# Patient Record
Sex: Male | Born: 1950
Health system: Southern US, Community
[De-identification: ages and names within clinical notes are randomized; demographics above are authoritative.]

## PROBLEM LIST (undated history)

## (undated) ENCOUNTER — Ambulatory Visit: Admission: EM | Payer: PPO

## (undated) DIAGNOSIS — I10 Essential (primary) hypertension: Secondary | ICD-10-CM

## (undated) DIAGNOSIS — N529 Male erectile dysfunction, unspecified: Secondary | ICD-10-CM

## (undated) DIAGNOSIS — M1990 Unspecified osteoarthritis, unspecified site: Secondary | ICD-10-CM

## (undated) DIAGNOSIS — E039 Hypothyroidism, unspecified: Secondary | ICD-10-CM

## (undated) DIAGNOSIS — L80 Vitiligo: Secondary | ICD-10-CM

## (undated) HISTORY — PX: ARTHROSCOPIC REPAIR ACL: SUR80

---

## 1998-11-09 HISTORY — PX: THYROID CYST EXCISION: SHX2511

## 2005-07-08 ENCOUNTER — Ambulatory Visit: Payer: Self-pay | Admitting: Internal Medicine

## 2007-01-24 ENCOUNTER — Ambulatory Visit: Payer: Self-pay | Admitting: Gastroenterology

## 2008-12-05 ENCOUNTER — Ambulatory Visit: Payer: Self-pay | Admitting: Internal Medicine

## 2012-02-26 ENCOUNTER — Ambulatory Visit: Payer: Self-pay

## 2012-03-07 ENCOUNTER — Ambulatory Visit: Payer: Self-pay

## 2012-10-21 IMAGING — CR DG SHOULDER 3+V*L*
1 series · 3 of 3 positions shown · non-contrast
Comparison: none

REASON FOR EXAM: L shoulder pain for a week
COMMENTS:

PROCEDURE:     MDR - MDR SHOULDER LEFT COMPLETE  - March 07, 2012  [DATE]
RESULT:     Degenerative change present. No acute abnormality identified. No
evidence of fracture.

[Series 1: internal rotate · 0.17mm/px · 3 of 3 slices shown]
[im 1/3]
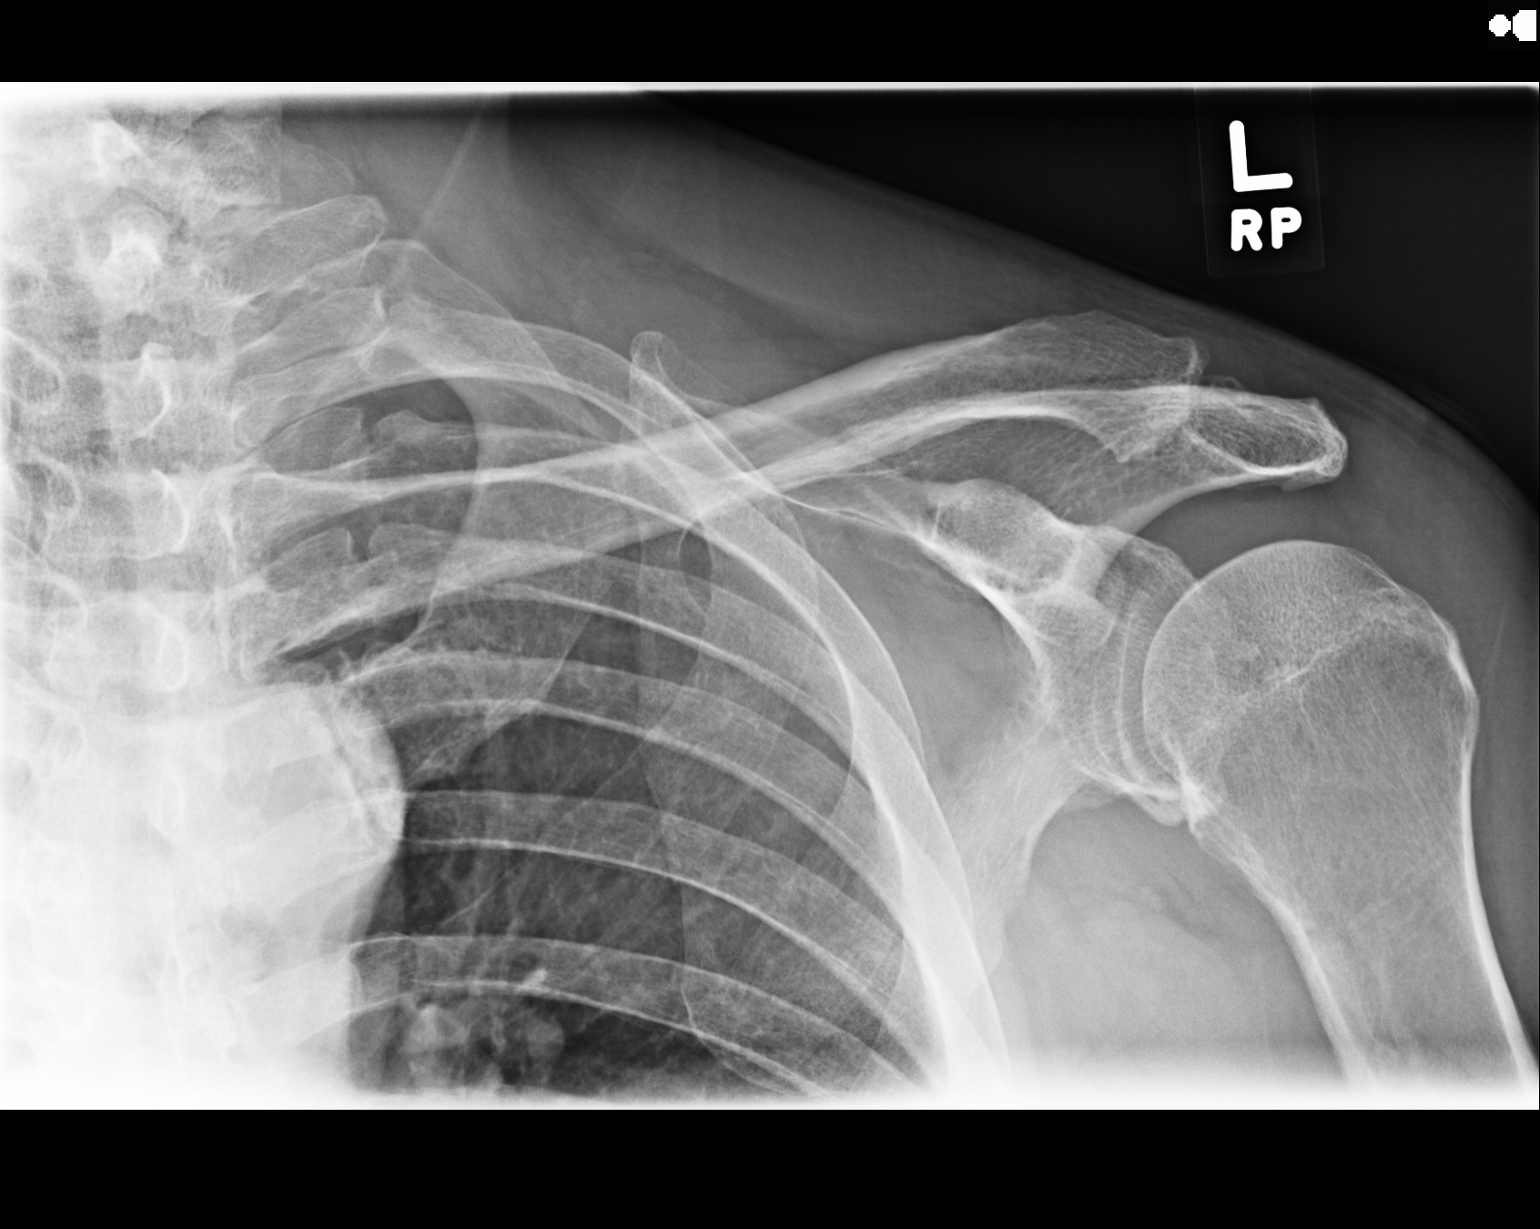
[im 2/3]
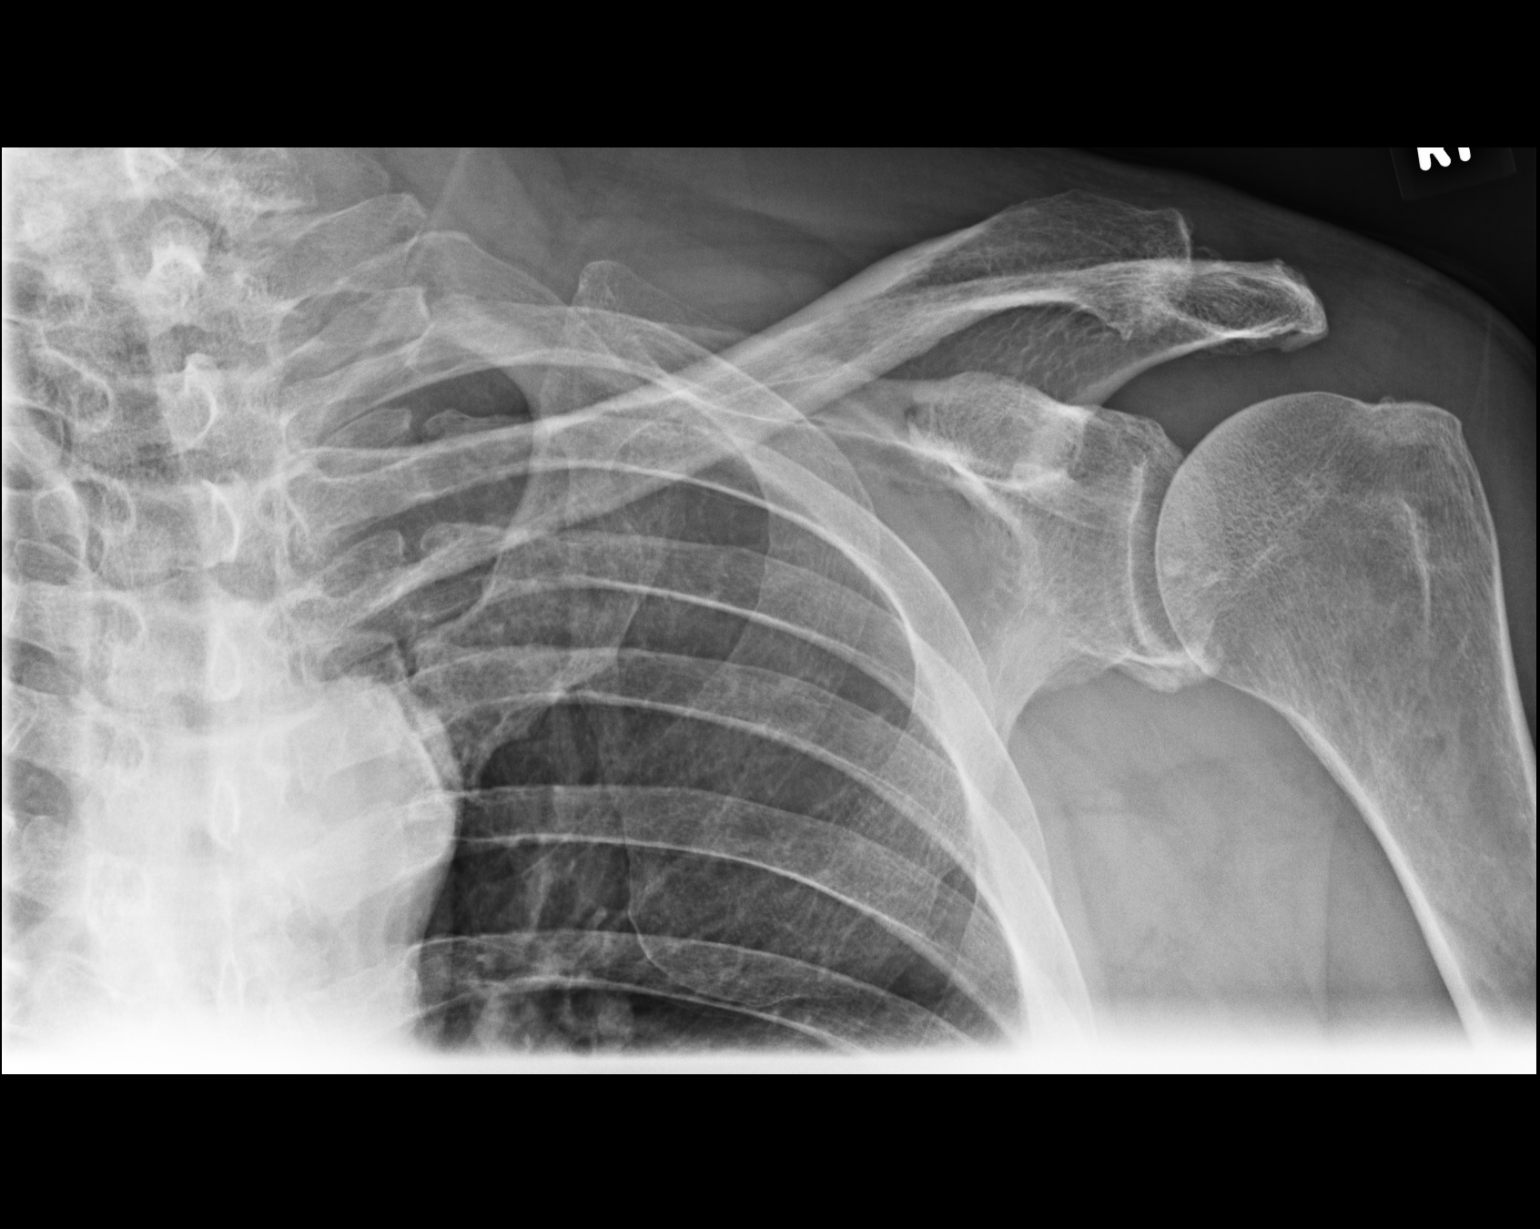
[im 3/3]
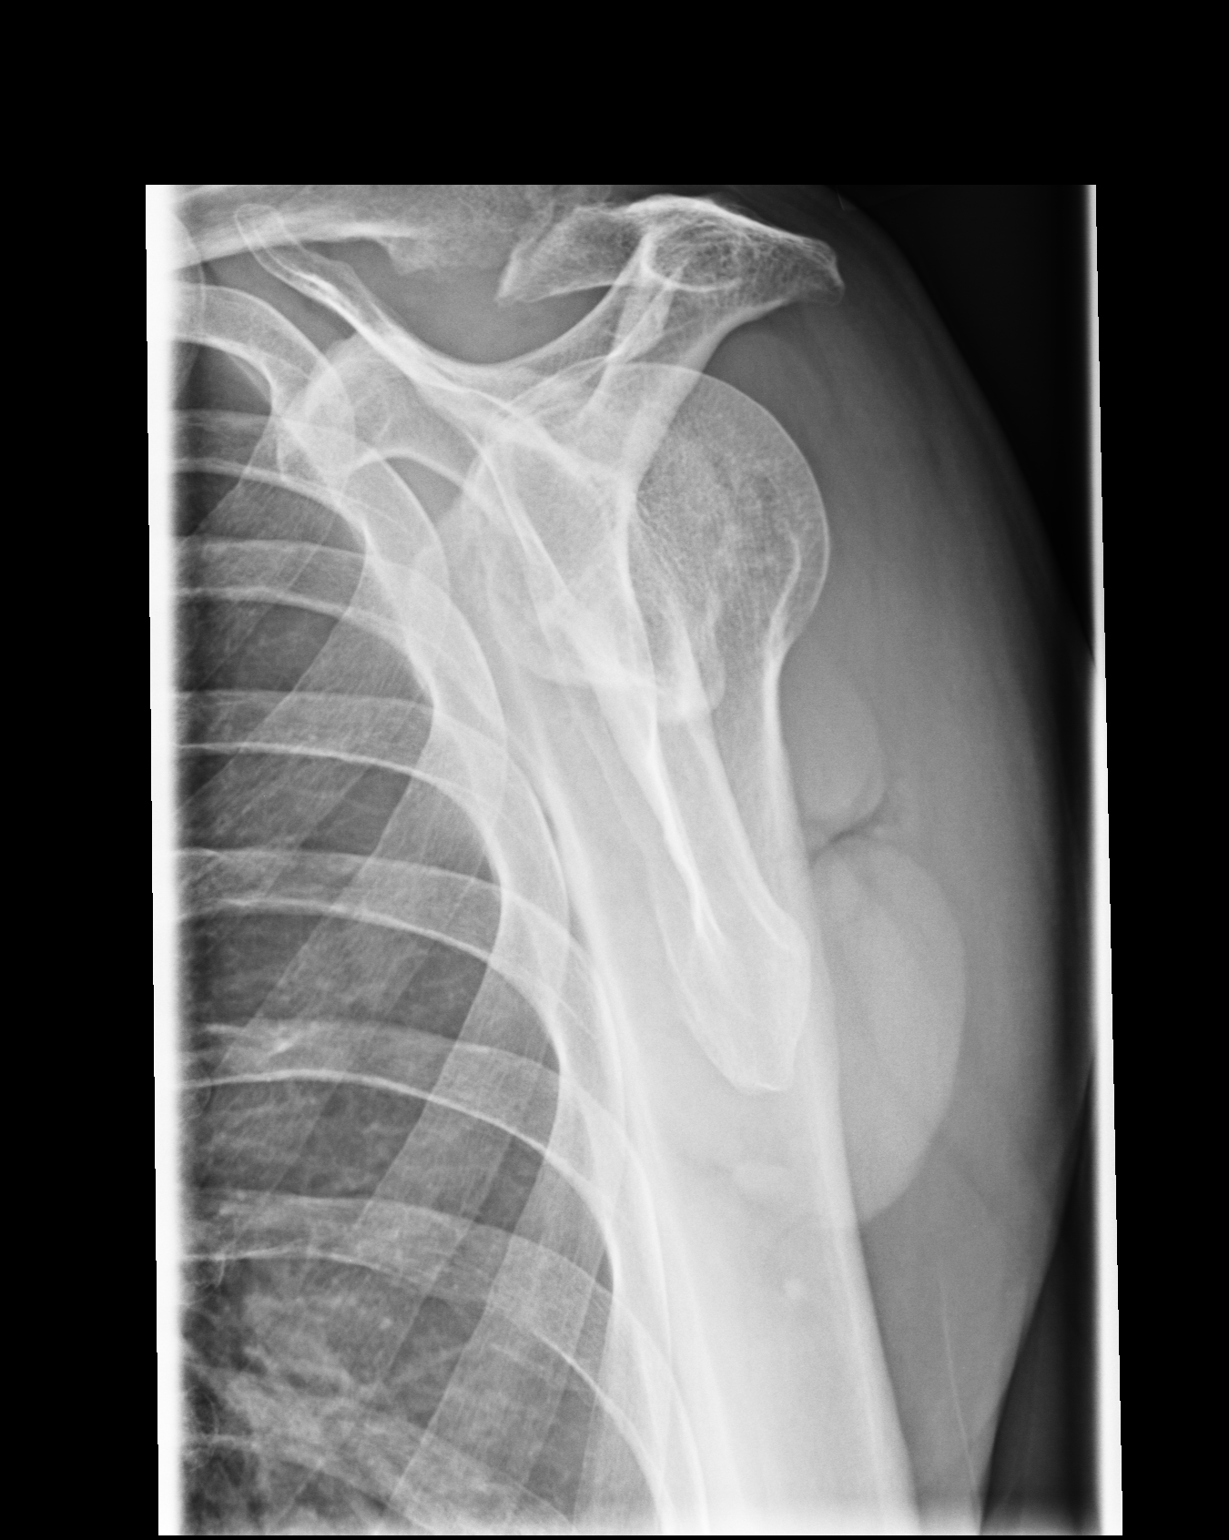

[3 of 3 positions shown; findings below may reference images not displayed]

IMPRESSION: No acute abnormality.

## 2013-01-02 ENCOUNTER — Ambulatory Visit: Payer: Self-pay | Admitting: Gastroenterology

## 2014-03-20 DIAGNOSIS — N529 Male erectile dysfunction, unspecified: Secondary | ICD-10-CM | POA: Insufficient documentation

## 2014-03-20 DIAGNOSIS — K635 Polyp of colon: Secondary | ICD-10-CM | POA: Insufficient documentation

## 2014-03-20 DIAGNOSIS — L80 Vitiligo: Secondary | ICD-10-CM | POA: Insufficient documentation

## 2014-03-20 DIAGNOSIS — M1990 Unspecified osteoarthritis, unspecified site: Secondary | ICD-10-CM | POA: Insufficient documentation

## 2014-08-13 DIAGNOSIS — I10 Essential (primary) hypertension: Secondary | ICD-10-CM | POA: Insufficient documentation

## 2014-08-13 DIAGNOSIS — E039 Hypothyroidism, unspecified: Secondary | ICD-10-CM | POA: Insufficient documentation

## 2016-01-17 ENCOUNTER — Encounter: Payer: Self-pay | Admitting: *Deleted

## 2016-01-20 ENCOUNTER — Ambulatory Visit
Admission: RE | Admit: 2016-01-20 | Discharge: 2016-01-20 | Disposition: A | Discharge status: Home / Self Care | Payer: Commercial Managed Care - PPO | Source: Ambulatory Visit | Attending: Gastroenterology | Admitting: Gastroenterology

## 2016-01-20 ENCOUNTER — Encounter
Admission: RE | Disposition: A | Discharge status: Home / Self Care | Payer: Self-pay | Source: Ambulatory Visit | Attending: Gastroenterology

## 2016-01-20 ENCOUNTER — Ambulatory Visit: Payer: Commercial Managed Care - PPO | Admitting: Certified Registered"

## 2016-01-20 ENCOUNTER — Encounter: Payer: Self-pay | Admitting: *Deleted

## 2016-01-20 DIAGNOSIS — M19012 Primary osteoarthritis, left shoulder: Secondary | ICD-10-CM | POA: Diagnosis not present

## 2016-01-20 DIAGNOSIS — K2960 Other gastritis without bleeding: Secondary | ICD-10-CM | POA: Diagnosis not present

## 2016-01-20 DIAGNOSIS — Z859 Personal history of malignant neoplasm, unspecified: Secondary | ICD-10-CM | POA: Insufficient documentation

## 2016-01-20 DIAGNOSIS — Z7951 Long term (current) use of inhaled steroids: Secondary | ICD-10-CM | POA: Insufficient documentation

## 2016-01-20 DIAGNOSIS — E039 Hypothyroidism, unspecified: Secondary | ICD-10-CM | POA: Diagnosis not present

## 2016-01-20 DIAGNOSIS — M19041 Primary osteoarthritis, right hand: Secondary | ICD-10-CM | POA: Insufficient documentation

## 2016-01-20 DIAGNOSIS — K317 Polyp of stomach and duodenum: Secondary | ICD-10-CM | POA: Diagnosis not present

## 2016-01-20 DIAGNOSIS — I10 Essential (primary) hypertension: Secondary | ICD-10-CM | POA: Insufficient documentation

## 2016-01-20 DIAGNOSIS — D509 Iron deficiency anemia, unspecified: Secondary | ICD-10-CM | POA: Diagnosis present

## 2016-01-20 DIAGNOSIS — Z86010 Personal history of colonic polyps: Secondary | ICD-10-CM | POA: Insufficient documentation

## 2016-01-20 DIAGNOSIS — L80 Vitiligo: Secondary | ICD-10-CM | POA: Insufficient documentation

## 2016-01-20 DIAGNOSIS — M19011 Primary osteoarthritis, right shoulder: Secondary | ICD-10-CM | POA: Insufficient documentation

## 2016-01-20 DIAGNOSIS — Z79899 Other long term (current) drug therapy: Secondary | ICD-10-CM | POA: Diagnosis not present

## 2016-01-20 DIAGNOSIS — K5730 Diverticulosis of large intestine without perforation or abscess without bleeding: Secondary | ICD-10-CM | POA: Diagnosis not present

## 2016-01-20 DIAGNOSIS — M19042 Primary osteoarthritis, left hand: Secondary | ICD-10-CM | POA: Insufficient documentation

## 2016-01-20 HISTORY — DX: Hypothyroidism, unspecified: E03.9

## 2016-01-20 HISTORY — PX: COLONOSCOPY WITH PROPOFOL: SHX5780

## 2016-01-20 HISTORY — DX: Vitiligo: L80

## 2016-01-20 HISTORY — DX: Male erectile dysfunction, unspecified: N52.9

## 2016-01-20 HISTORY — DX: Essential (primary) hypertension: I10

## 2016-01-20 HISTORY — DX: Unspecified osteoarthritis, unspecified site: M19.90

## 2016-01-20 HISTORY — PX: ESOPHAGOGASTRODUODENOSCOPY (EGD) WITH PROPOFOL: SHX5813

## 2016-01-20 SURGERY — COLONOSCOPY WITH PROPOFOL
Anesthesia: General

## 2016-01-20 MED ORDER — SODIUM CHLORIDE 0.9 % IV SOLN
INTRAVENOUS | Status: DC
  Administered 2016-01-20 (×2): via INTRAVENOUS

## 2016-01-20 MED ORDER — MIDAZOLAM HCL 2 MG/2ML IJ SOLN
INTRAMUSCULAR | Status: DC | PRN
Start: 2016-01-20 — End: 2016-01-20
  Administered 2016-01-20: 2 mg via INTRAVENOUS

## 2016-01-20 MED ORDER — SODIUM CHLORIDE 0.9 % IV SOLN: INTRAVENOUS | Status: DC

## 2016-01-20 MED ORDER — LIDOCAINE HCL (CARDIAC) 20 MG/ML IV SOLN
INTRAVENOUS | Status: DC | PRN
  Administered 2016-01-20: 60 mg via INTRAVENOUS

## 2016-01-20 MED ORDER — PROPOFOL 500 MG/50ML IV EMUL
INTRAVENOUS | Status: DC | PRN
  Administered 2016-01-20: 150 ug/kg/min via INTRAVENOUS

## 2016-01-20 MED ORDER — PROPOFOL 10 MG/ML IV BOLUS
INTRAVENOUS | Status: DC | PRN
  Administered 2016-01-20: 40 mg via INTRAVENOUS

## 2016-01-20 MED ORDER — PHENYLEPHRINE HCL 10 MG/ML IJ SOLN
INTRAMUSCULAR | Status: DC | PRN
  Administered 2016-01-20: 100 ug via INTRAVENOUS

## 2016-01-20 NOTE — Op Note (Signed)
[facility] Gastroenterology Patient Name: Mitchell Mcdowell Procedure Date: 01/20/2016 1:10 PM MRN: [id number] Account #: [id number] Date of Birth: 1951/01/21 Admit Type: Outpatient Age: 65 Room: [facility] ENDO ROOM 4 Gender: Male Note Status: Finalized Procedure:            Upper GI endoscopy Indications:          Iron deficiency anemia Providers:            Lupita Dawn. Candace Cruise, MD Referring MD:         Dion Body (Referring MD) Medicines:            Monitored Anesthesia Care Complications:        No immediate complications. Procedure:            Pre-Anesthesia Assessment:                       - Prior to the procedure, a History and Physical was                        performed, and patient medications, allergies and                        sensitivities were reviewed. The patient's tolerance of                        previous anesthesia was reviewed.                       - The risks and benefits of the procedure and the                        sedation options and risks were discussed with the                        patient. All questions were answered and informed                        consent was obtained.                       - After reviewing the risks and benefits, the patient                        was deemed in satisfactory condition to undergo the                        procedure.                       After obtaining informed consent, the endoscope was                        passed under direct vision. Throughout the procedure,                        the patient's blood pressure, pulse, and oxygen                        saturations were monitored continuously. The Endoscope  was introduced through the mouth, and advanced to the                        second part of duodenum. The upper GI endoscopy was                        accomplished without difficulty. The patient tolerated                        the procedure well. Findings:  The examined esophagus was normal.      Multiple small sessile polyps were found in the gastric fundus. Biopsies       were taken with a cold forceps for histology.      The exam was otherwise without abnormality.      The examined duodenum was normal. Impression:           - Normal esophagus.                       - Multiple gastric polyps. Biopsied.                       - The examination was otherwise normal.                       - Normal examined duodenum. Recommendation:       - Discharge patient to home.                       - Observe patient's clinical course.                       - Await pathology results.                       - The findings and recommendations were discussed with                        the patient. Procedure Code(s):    --- Professional ---                       [phone], Esophagogastroduodenoscopy, flexible, transoral;                        with biopsy, single or multiple Diagnosis Code(s):    --- Professional ---                       K31.7, Polyp of stomach and duodenum                       D50.9, Iron deficiency anemia, unspecified CPT copyright 2016 American Medical Association. All rights reserved. The codes documented in this report are preliminary and upon coder review may  be revised to meet current compliance requirements. Hulen Luster, MD 01/20/2016 1:19:34 PM This report has been signed electronically. Number of Addenda: 0 Note Initiated On: 01/20/2016 1:10 PM      [facility]

## 2016-01-20 NOTE — H&P (Signed)
  Date of Initial H&P:12/31/2015  History reviewed, patient examined, no change in status, stable for surgery.

## 2016-01-20 NOTE — Anesthesia Preprocedure Evaluation (Signed)
Anesthesia Evaluation  Patient identified by MRN, date of birth, ID band Patient awake    Reviewed: Allergy & Precautions, H&P , NPO status , Patient's Chart, lab work & pertinent test results, reviewed documented beta blocker date and time   Airway Mallampati: II   Neck ROM: full    Dental  (+) Poor Dentition   Pulmonary neg pulmonary ROS,    Pulmonary exam normal        Cardiovascular hypertension, negative cardio ROS Normal cardiovascular exam     Neuro/Psych negative neurological ROS  negative psych ROS   GI/Hepatic negative GI ROS, Neg liver ROS,   Endo/Other  negative endocrine ROSHypothyroidism   Renal/GU negative Renal ROS  negative genitourinary   Musculoskeletal   Abdominal   Peds  Hematology negative hematology ROS (+)   Anesthesia Other Findings Past Medical History:   Hypertension                                                 Hypothyroidism                                               Arthritis                                                    Vitiligo                                                     Impotence                                                  Past Surgical History:   ARTHROSCOPIC REPAIR ACL                         Right 35 years *   THYROID CYST EXCISION                            2000       BMI    Body Mass Index   24.41 kg/m 2     Reproductive/Obstetrics                             Anesthesia Physical Anesthesia Plan  ASA: III  Anesthesia Plan: General   Post-op Pain Management:    Induction:   Airway Management Planned:   Additional Equipment:   Intra-op Plan:   Post-operative Plan:   Informed Consent: I have reviewed the patients History and Physical, chart, labs and discussed the procedure including the risks, benefits and alternatives for the proposed anesthesia with the patient or authorized representative who has indicated  his/her understanding and acceptance.  Dental Advisory Given  Plan Discussed with: CRNA  Anesthesia Plan Comments:         Anesthesia Quick Evaluation

## 2016-01-20 NOTE — Transfer of Care (Signed)
Immediate Anesthesia Transfer of Care Note  Patient: Mitchell Mcdowell  Procedure(s) Performed: Procedure(s): COLONOSCOPY WITH PROPOFOL (N/A) ESOPHAGOGASTRODUODENOSCOPY (EGD) WITH PROPOFOL (N/A)  Patient Location: Endoscopy Unit  Anesthesia Type:General  Level of Consciousness: awake, alert , oriented and patient cooperative  Airway & Oxygen Therapy: Patient Spontanous Breathing and Patient connected to nasal cannula oxygen  Post-op Assessment: Report given to RN, Post -op Vital signs reviewed and stable and Patient moving all extremities X 4  Post vital signs: Reviewed and stable  Last Vitals:  Filed Vitals:   01/20/16 1246  BP: 149/84  Pulse: 82  Temp: 36.4 C  Resp: 18    Complications: No apparent anesthesia complications

## 2016-01-20 NOTE — Op Note (Signed)
[facility] Gastroenterology Patient Name: Mitchell Mcdowell Procedure Date: 01/20/2016 1:09 PM MRN: [id number] Account #: [id number] Date of Birth: Feb 29, 1951 Admit Type: Outpatient Age: 65 Room: [facility] ENDO ROOM 4 Gender: Male Note Status: Finalized Procedure:            Colonoscopy Indications:          Iron deficiency anemia, Personal history of colonic                        polyps Providers:            Lupita Dawn. Candace Cruise, MD Referring MD:         Dion Body (Referring MD) Medicines:            Monitored Anesthesia Care Complications:        No immediate complications. Procedure:            Pre-Anesthesia Assessment:                       - Prior to the procedure, a History and Physical was                        performed, and patient medications, allergies and                        sensitivities were reviewed. The patient's tolerance of                        previous anesthesia was reviewed.                       - The risks and benefits of the procedure and the                        sedation options and risks were discussed with the                        patient. All questions were answered and informed                        consent was obtained.                       - After reviewing the risks and benefits, the patient                        was deemed in satisfactory condition to undergo the                        procedure.                       After obtaining informed consent, the colonoscope was                        passed under direct vision. Throughout the procedure,                        the patient's blood pressure, pulse, and oxygen                        saturations were monitored  continuously. The                        Colonoscope was introduced through the anus and                        advanced to the the cecum, identified by appendiceal                        orifice and ileocecal valve. The colonoscopy was   performed without difficulty. The patient tolerated the                        procedure well. The quality of the bowel preparation                        was fair. Findings:      A few small and large-mouthed diverticula were found in the sigmoid       colon.      The exam was otherwise without abnormality. Impression:           - Preparation of the colon was fair.                       - Diverticulosis in the sigmoid colon.                       - The examination was otherwise normal.                       - No specimens collected. Recommendation:       - Discharge patient to home.                       - Repeat colonoscopy in 5 years for surveillance. Procedure Code(s):    --- Professional ---                       [phone], Colonoscopy, flexible; diagnostic, including                        collection of specimen(s) by brushing or washing, when                        performed (separate procedure) Diagnosis Code(s):    --- Professional ---                       D50.9, Iron deficiency anemia, unspecified                       Z86.010, Personal history of colonic polyps                       K57.30, Diverticulosis of large intestine without                        perforation or abscess without bleeding CPT copyright 2016 American Medical Association. All rights reserved. The codes documented in this report are preliminary and upon coder review may  be revised to meet current compliance requirements. Hulen Luster, MD 01/20/2016 1:36:37 PM This report has been signed electronically. Number of Addenda: 0 Note Initiated On: 01/20/2016 1:09 PM Scope  Withdrawal Time: 0 hours 8 minutes 13 seconds  Total Procedure Duration: 0 hours 12 minutes 16 seconds       [facility]

## 2016-01-21 ENCOUNTER — Encounter: Payer: Self-pay | Admitting: Gastroenterology

## 2016-01-21 LAB — SURGICAL PATHOLOGY

## 2016-01-21 NOTE — Anesthesia Postprocedure Evaluation (Signed)
Anesthesia Post Note  Patient: Mitchell Mcdowell  Procedure(s) Performed: Procedure(s) (LRB): COLONOSCOPY WITH PROPOFOL (N/A) ESOPHAGOGASTRODUODENOSCOPY (EGD) WITH PROPOFOL (N/A)  Patient location during evaluation: PACU Anesthesia Type: General Level of consciousness: awake and alert Pain management: pain level controlled Vital Signs Assessment: post-procedure vital signs reviewed and stable Respiratory status: spontaneous breathing, nonlabored ventilation, respiratory function stable and patient connected to nasal cannula oxygen Cardiovascular status: blood pressure returned to baseline and stable Postop Assessment: no signs of nausea or vomiting Anesthetic complications: no    Last Vitals:  Filed Vitals:   01/20/16 1358 01/20/16 1408  BP: 106/42 119/77  Pulse: 66 65  Temp:    Resp: 20 17    Last Pain: There were no vitals filed for this visit.               Molli Barrows

## 2019-01-11 DIAGNOSIS — E039 Hypothyroidism, unspecified: Secondary | ICD-10-CM | POA: Diagnosis not present

## 2019-01-11 DIAGNOSIS — I10 Essential (primary) hypertension: Secondary | ICD-10-CM | POA: Diagnosis not present

## 2019-01-11 DIAGNOSIS — Z79899 Other long term (current) drug therapy: Secondary | ICD-10-CM | POA: Diagnosis not present

## 2019-01-11 DIAGNOSIS — M0609 Rheumatoid arthritis without rheumatoid factor, multiple sites: Secondary | ICD-10-CM | POA: Diagnosis not present

## 2019-01-17 DIAGNOSIS — M150 Primary generalized (osteo)arthritis: Secondary | ICD-10-CM | POA: Diagnosis not present

## 2019-01-17 DIAGNOSIS — D696 Thrombocytopenia, unspecified: Secondary | ICD-10-CM | POA: Diagnosis not present

## 2019-01-17 DIAGNOSIS — M0609 Rheumatoid arthritis without rheumatoid factor, multiple sites: Secondary | ICD-10-CM | POA: Diagnosis not present

## 2019-01-17 DIAGNOSIS — Z79899 Other long term (current) drug therapy: Secondary | ICD-10-CM | POA: Diagnosis not present

## 2019-01-18 DIAGNOSIS — I10 Essential (primary) hypertension: Secondary | ICD-10-CM | POA: Diagnosis not present

## 2019-01-18 DIAGNOSIS — E039 Hypothyroidism, unspecified: Secondary | ICD-10-CM | POA: Diagnosis not present

## 2019-01-18 DIAGNOSIS — D696 Thrombocytopenia, unspecified: Secondary | ICD-10-CM | POA: Diagnosis not present

## 2019-04-19 DIAGNOSIS — Z79899 Other long term (current) drug therapy: Secondary | ICD-10-CM | POA: Diagnosis not present

## 2019-04-19 DIAGNOSIS — M0609 Rheumatoid arthritis without rheumatoid factor, multiple sites: Secondary | ICD-10-CM | POA: Diagnosis not present

## 2019-07-13 DIAGNOSIS — Z79899 Other long term (current) drug therapy: Secondary | ICD-10-CM | POA: Diagnosis not present

## 2019-07-13 DIAGNOSIS — M0609 Rheumatoid arthritis without rheumatoid factor, multiple sites: Secondary | ICD-10-CM | POA: Diagnosis not present

## 2019-07-20 DIAGNOSIS — M0609 Rheumatoid arthritis without rheumatoid factor, multiple sites: Secondary | ICD-10-CM | POA: Diagnosis not present

## 2019-07-20 DIAGNOSIS — E039 Hypothyroidism, unspecified: Secondary | ICD-10-CM | POA: Diagnosis not present

## 2019-07-20 DIAGNOSIS — M8949 Other hypertrophic osteoarthropathy, multiple sites: Secondary | ICD-10-CM | POA: Diagnosis not present

## 2019-07-20 DIAGNOSIS — I10 Essential (primary) hypertension: Secondary | ICD-10-CM | POA: Diagnosis not present

## 2019-07-20 DIAGNOSIS — Z0000 Encounter for general adult medical examination without abnormal findings: Secondary | ICD-10-CM | POA: Diagnosis not present

## 2019-07-20 DIAGNOSIS — Z79899 Other long term (current) drug therapy: Secondary | ICD-10-CM | POA: Diagnosis not present

## 2019-07-20 DIAGNOSIS — D696 Thrombocytopenia, unspecified: Secondary | ICD-10-CM | POA: Diagnosis not present

## 2019-07-27 DIAGNOSIS — Z0000 Encounter for general adult medical examination without abnormal findings: Secondary | ICD-10-CM | POA: Diagnosis not present

## 2019-07-27 DIAGNOSIS — I10 Essential (primary) hypertension: Secondary | ICD-10-CM | POA: Diagnosis not present

## 2019-07-27 DIAGNOSIS — E039 Hypothyroidism, unspecified: Secondary | ICD-10-CM | POA: Diagnosis not present

## 2019-07-31 DIAGNOSIS — L80 Vitiligo: Secondary | ICD-10-CM | POA: Diagnosis not present

## 2019-07-31 DIAGNOSIS — L570 Actinic keratosis: Secondary | ICD-10-CM | POA: Diagnosis not present

## 2019-07-31 DIAGNOSIS — L578 Other skin changes due to chronic exposure to nonionizing radiation: Secondary | ICD-10-CM | POA: Diagnosis not present

## 2019-07-31 DIAGNOSIS — Z1283 Encounter for screening for malignant neoplasm of skin: Secondary | ICD-10-CM | POA: Diagnosis not present

## 2019-07-31 DIAGNOSIS — L821 Other seborrheic keratosis: Secondary | ICD-10-CM | POA: Diagnosis not present

## 2019-08-25 DIAGNOSIS — D696 Thrombocytopenia, unspecified: Secondary | ICD-10-CM | POA: Diagnosis not present

## 2019-08-25 DIAGNOSIS — M0609 Rheumatoid arthritis without rheumatoid factor, multiple sites: Secondary | ICD-10-CM | POA: Diagnosis not present

## 2019-08-25 DIAGNOSIS — M8949 Other hypertrophic osteoarthropathy, multiple sites: Secondary | ICD-10-CM | POA: Diagnosis not present

## 2019-09-12 DIAGNOSIS — H2513 Age-related nuclear cataract, bilateral: Secondary | ICD-10-CM | POA: Diagnosis not present

## 2019-09-21 DIAGNOSIS — E039 Hypothyroidism, unspecified: Secondary | ICD-10-CM | POA: Diagnosis not present

## 2019-09-25 ENCOUNTER — Encounter: Payer: Self-pay | Admitting: *Deleted

## 2019-09-25 ENCOUNTER — Other Ambulatory Visit: Payer: Self-pay

## 2019-09-25 DIAGNOSIS — M069 Rheumatoid arthritis, unspecified: Secondary | ICD-10-CM | POA: Diagnosis not present

## 2019-09-25 DIAGNOSIS — H2511 Age-related nuclear cataract, right eye: Secondary | ICD-10-CM | POA: Diagnosis not present

## 2019-09-28 ENCOUNTER — Other Ambulatory Visit
Admission: RE | Admit: 2019-09-28 | Discharge: 2019-09-28 | Disposition: A | Discharge status: Home / Self Care | Payer: PPO | Source: Ambulatory Visit | Attending: Ophthalmology | Admitting: Ophthalmology

## 2019-09-28 DIAGNOSIS — Z01812 Encounter for preprocedural laboratory examination: Secondary | ICD-10-CM | POA: Insufficient documentation

## 2019-09-28 DIAGNOSIS — Z20828 Contact with and (suspected) exposure to other viral communicable diseases: Secondary | ICD-10-CM | POA: Insufficient documentation

## 2019-09-28 LAB — SARS CORONAVIRUS 2 (TAT 6-24 HRS): SARS Coronavirus 2: NEGATIVE

## 2019-09-28 NOTE — Discharge Instructions (Signed)

## 2019-10-02 ENCOUNTER — Ambulatory Visit: Payer: PPO | Admitting: Anesthesiology

## 2019-10-02 ENCOUNTER — Ambulatory Visit
Admission: RE | Admit: 2019-10-02 | Discharge: 2019-10-02 | Disposition: A | Discharge status: Home / Self Care | Payer: PPO | Attending: Ophthalmology | Admitting: Ophthalmology

## 2019-10-02 ENCOUNTER — Encounter
Admission: RE | Disposition: A | Discharge status: Home / Self Care | Payer: Self-pay | Source: Home / Self Care | Attending: Ophthalmology

## 2019-10-02 DIAGNOSIS — I10 Essential (primary) hypertension: Secondary | ICD-10-CM | POA: Diagnosis not present

## 2019-10-02 DIAGNOSIS — H2511 Age-related nuclear cataract, right eye: Secondary | ICD-10-CM | POA: Diagnosis not present

## 2019-10-02 DIAGNOSIS — H25811 Combined forms of age-related cataract, right eye: Secondary | ICD-10-CM | POA: Diagnosis not present

## 2019-10-02 DIAGNOSIS — M069 Rheumatoid arthritis, unspecified: Secondary | ICD-10-CM | POA: Insufficient documentation

## 2019-10-02 HISTORY — PX: CATARACT EXTRACTION W/PHACO: SHX586

## 2019-10-02 SURGERY — PHACOEMULSIFICATION, CATARACT, WITH IOL INSERTION
Anesthesia: Monitor Anesthesia Care | Site: Eye | Laterality: Right

## 2019-10-02 MED ORDER — SODIUM HYALURONATE 23 MG/ML IO SOLN
INTRAOCULAR | Status: DC | PRN
  Administered 2019-10-02: 0.6 mL via INTRAOCULAR

## 2019-10-02 MED ORDER — SODIUM HYALURONATE 10 MG/ML IO SOLN
INTRAOCULAR | Status: DC | PRN
  Administered 2019-10-02: 0.55 mL via INTRAOCULAR

## 2019-10-02 MED ORDER — ONDANSETRON HCL 4 MG/2ML IJ SOLN: 4.0000 mg | Freq: Once | INTRAMUSCULAR | Status: DC | PRN

## 2019-10-02 MED ORDER — EPINEPHRINE PF 1 MG/ML IJ SOLN
INTRAOCULAR | Status: DC | PRN
  Administered 2019-10-02: 62 mL via OPHTHALMIC

## 2019-10-02 MED ORDER — FENTANYL CITRATE (PF) 100 MCG/2ML IJ SOLN
INTRAMUSCULAR | Status: DC | PRN
  Administered 2019-10-02 (×2): 50 ug via INTRAVENOUS

## 2019-10-02 MED ORDER — ARMC OPHTHALMIC DILATING DROPS
1.0000 "application " | OPHTHALMIC | Status: DC | PRN
  Administered 2019-10-02 (×3): 1 via OPHTHALMIC

## 2019-10-02 MED ORDER — LACTATED RINGERS IV SOLN: 100.0000 mL/h | INTRAVENOUS | Status: DC

## 2019-10-02 MED ORDER — MIDAZOLAM HCL 2 MG/2ML IJ SOLN
INTRAMUSCULAR | Status: DC | PRN
  Administered 2019-10-02: 2 mg via INTRAVENOUS

## 2019-10-02 MED ORDER — LIDOCAINE HCL (PF) 2 % IJ SOLN
INTRAOCULAR | Status: DC | PRN
  Administered 2019-10-02: 2 mL via INTRAOCULAR

## 2019-10-02 MED ORDER — MOXIFLOXACIN HCL 0.5 % OP SOLN
OPHTHALMIC | Status: DC | PRN
  Administered 2019-10-02: 0.2 mL via OPHTHALMIC

## 2019-10-02 MED ORDER — TETRACAINE HCL 0.5 % OP SOLN
1.0000 [drp] | OPHTHALMIC | Status: DC | PRN
  Administered 2019-10-02 (×3): 1 [drp] via OPHTHALMIC

## 2019-10-02 SURGICAL SUPPLY — 19 items
CANNULA ANT/CHMB 27G (MISCELLANEOUS) ×2 IMPLANT
CANNULA ANT/CHMB 27GA (MISCELLANEOUS) ×4 IMPLANT
DISSECTOR HYDRO NUCLEUS 50X22 (MISCELLANEOUS) ×2 IMPLANT
GLOVE SURG LX 7.5 STRW (GLOVE) ×1
GLOVE SURG LX STRL 7.5 STRW (GLOVE) ×1 IMPLANT
GLOVE SURG SYN 8.5  E (GLOVE) ×1
GLOVE SURG SYN 8.5 E (GLOVE) ×1 IMPLANT
GLOVE SURG SYN 8.5 PF PI (GLOVE) ×1 IMPLANT
GOWN STRL REUS W/ TWL LRG LVL3 (GOWN DISPOSABLE) ×2 IMPLANT
GOWN STRL REUS W/TWL LRG LVL3 (GOWN DISPOSABLE) ×2
LENS IOL TECNIS ITEC 20.0 (Intraocular Lens) ×1 IMPLANT
MARKER SKIN DUAL TIP RULER LAB (MISCELLANEOUS) ×2 IMPLANT
PACK DR. KING ARMS (PACKS) ×2 IMPLANT
PACK EYE AFTER SURG (MISCELLANEOUS) ×2 IMPLANT
PACK OPTHALMIC (MISCELLANEOUS) ×2 IMPLANT
SYR 3ML LL SCALE MARK (SYRINGE) ×2 IMPLANT
SYR TB 1ML LUER SLIP (SYRINGE) ×2 IMPLANT
WATER STERILE IRR 250ML POUR (IV SOLUTION) ×2 IMPLANT
WIPE NON LINTING 3.25X3.25 (MISCELLANEOUS) ×2 IMPLANT

## 2019-10-02 NOTE — Anesthesia Procedure Notes (Signed)
Procedure Name: MAC Performed by: Izetta Dakin, CRNA Pre-anesthesia Checklist: Timeout performed, Patient being monitored, Suction available, Emergency Drugs available and Patient identified Patient Re-evaluated:Patient Re-evaluated prior to induction Oxygen Delivery Method: Nasal cannula

## 2019-10-02 NOTE — H&P (Signed)

## 2019-10-02 NOTE — Op Note (Signed)
OPERATIVE NOTE  ACHARY ARAGONA [id number] 10/02/2019   PREOPERATIVE DIAGNOSIS:  Nuclear sclerotic cataract right eye.  H25.11   POSTOPERATIVE DIAGNOSIS:    Nuclear sclerotic cataract right eye.     PROCEDURE:  Phacoemusification with posterior chamber intraocular lens placement of the right eye   LENS:   Implant Name Type Inv. Item Serial No. Manufacturer Lot No. LRB No. Used Action  LENS IOL DIOP 20.0 - [id number] Intraocular Lens LENS IOL DIOP 20.0 [id number]  Right 1 Implanted       Procedure(s): CATARACT EXTRACTION PHACO AND INTRAOCULAR LENS PLACEMENT (IOC) RIGHT 2.05,   00:26.0 (Right)  PCB00 +20.0   ULTRASOUND TIME: 0 minutes 26 seconds.  CDE 2.05   SURGEON:  Benay Pillow, MD, MPH  ANESTHESIOLOGIST: Anesthesiologist: Ardeth Sportsman, MD CRNA: Izetta Dakin, CRNA   ANESTHESIA:  Topical with tetracaine drops augmented with 1% preservative-free intracameral lidocaine.  ESTIMATED BLOOD LOSS: less than 1 mL.   COMPLICATIONS:  None.   DESCRIPTION OF PROCEDURE:  The patient was identified in the holding room and transported to the operating room and placed in the supine position under the operating microscope.  The right eye was identified as the operative eye and it was prepped and draped in the usual sterile ophthalmic fashion.   A 1.0 millimeter clear-corneal paracentesis was made at the 10:30 position. 0.5 ml of preservative-free 1% lidocaine with epinephrine was injected into the anterior chamber.  The anterior chamber was filled with Healon 5 viscoelastic.  A 2.4 millimeter keratome was used to make a near-clear corneal incision at the 8:00 position.  A curvilinear capsulorrhexis was made with a cystotome and capsulorrhexis forceps.  Balanced salt solution was used to hydrodissect and hydrodelineate the nucleus.   Phacoemulsification was then used in stop and chop fashion to remove the lens nucleus and epinucleus.  The remaining cortex was then removed using the  irrigation and aspiration handpiece. Healon was then placed into the capsular bag to distend it for lens placement.  A lens was then injected into the capsular bag.  The remaining viscoelastic was aspirated.   Wounds were hydrated with balanced salt solution.  The anterior chamber was inflated to a physiologic pressure with balanced salt solution.   Intracameral vigamox 0.1 mL undiluted was injected into the eye and a drop placed onto the ocular surface.  No wound leaks were noted.  The patient was taken to the recovery room in stable condition without complications of anesthesia or surgery  Benay Pillow 10/02/2019, 9:53 AM

## 2019-10-02 NOTE — Anesthesia Procedure Notes (Signed)
Procedure Name: MAC Performed by: Aliceson Dolbow M, CRNA Pre-anesthesia Checklist: Timeout performed, Patient being monitored, Suction available, Emergency Drugs available and Patient identified Patient Re-evaluated:Patient Re-evaluated prior to induction Oxygen Delivery Method: Nasal cannula       

## 2019-10-02 NOTE — Anesthesia Preprocedure Evaluation (Signed)
Anesthesia Evaluation  Patient identified by MRN, date of birth, ID band Patient awake    Reviewed: Allergy & Precautions, NPO status , Patient's Chart, lab work & pertinent test results, reviewed documented beta blocker date and time   History of Anesthesia Complications Negative for: history of anesthetic complications  Airway Mallampati: II  TM Distance: >3 FB Neck ROM: Full    Dental no notable dental hx.    Pulmonary neg pulmonary ROS,    Pulmonary exam normal breath sounds clear to auscultation       Cardiovascular hypertension, Normal cardiovascular exam Rhythm:Regular Rate:Normal     Neuro/Psych cataract negative psych ROS   GI/Hepatic negative GI ROS, Neg liver ROS,   Endo/Other  Hypothyroidism   Renal/GU negative Renal ROS  negative genitourinary   Musculoskeletal  (+) Arthritis ,   Abdominal Normal abdominal exam  (+)   Peds  Hematology negative hematology ROS (+)   Anesthesia Other Findings   Reproductive/Obstetrics                             Anesthesia Physical Anesthesia Plan  ASA: II  Anesthesia Plan: MAC   Post-op Pain Management:    Induction: Intravenous  PONV Risk Score and Plan: 1 and Ondansetron  Airway Management Planned: Nasal Cannula  Additional Equipment:   Intra-op Plan:   Post-operative Plan:   Informed Consent: I have reviewed the patients History and Physical, chart, labs and discussed the procedure including the risks, benefits and alternatives for the proposed anesthesia with the patient or authorized representative who has indicated his/her understanding and acceptance.     Dental advisory given  Plan Discussed with: CRNA  Anesthesia Plan Comments:         Anesthesia Quick Evaluation

## 2019-10-02 NOTE — Anesthesia Postprocedure Evaluation (Signed)
Anesthesia Post Note  Patient: Mitchell Mcdowell  Procedure(s) Performed: CATARACT EXTRACTION PHACO AND INTRAOCULAR LENS PLACEMENT (IOC) RIGHT 2.05,   00:26.0 (Right Eye)     Anesthesia Post Evaluation  Younes Degeorge

## 2019-10-02 NOTE — Transfer of Care (Signed)
Immediate Anesthesia Transfer of Care Note  Patient: Mitchell Mcdowell  Procedure(s) Performed: CATARACT EXTRACTION PHACO AND INTRAOCULAR LENS PLACEMENT (IOC) RIGHT 2.05,   00:26.0 (Right Eye)  Patient Location: PACU  Anesthesia Type: MAC  Level of Consciousness: awake, alert  and patient cooperative  Airway and Oxygen Therapy: Patient Spontanous Breathing and Patient connected to supplemental oxygen  Post-op Assessment: Post-op Vital signs reviewed, Patient's Cardiovascular Status Stable, Respiratory Function Stable, Patent Airway and No signs of Nausea or vomiting  Post-op Vital Signs: Reviewed and stable  Complications: No apparent anesthesia complications

## 2019-10-03 ENCOUNTER — Encounter: Payer: Self-pay | Admitting: Ophthalmology

## 2019-10-19 DIAGNOSIS — M0609 Rheumatoid arthritis without rheumatoid factor, multiple sites: Secondary | ICD-10-CM | POA: Diagnosis not present

## 2019-10-19 DIAGNOSIS — Z79899 Other long term (current) drug therapy: Secondary | ICD-10-CM | POA: Diagnosis not present

## 2019-10-26 DIAGNOSIS — Z79899 Other long term (current) drug therapy: Secondary | ICD-10-CM | POA: Diagnosis not present

## 2019-10-26 DIAGNOSIS — D696 Thrombocytopenia, unspecified: Secondary | ICD-10-CM | POA: Diagnosis not present

## 2019-10-26 DIAGNOSIS — M0609 Rheumatoid arthritis without rheumatoid factor, multiple sites: Secondary | ICD-10-CM | POA: Diagnosis not present

## 2019-10-26 DIAGNOSIS — M8949 Other hypertrophic osteoarthropathy, multiple sites: Secondary | ICD-10-CM | POA: Diagnosis not present

## 2020-01-22 DIAGNOSIS — M0609 Rheumatoid arthritis without rheumatoid factor, multiple sites: Secondary | ICD-10-CM | POA: Diagnosis not present

## 2020-01-22 DIAGNOSIS — E039 Hypothyroidism, unspecified: Secondary | ICD-10-CM | POA: Diagnosis not present

## 2020-01-22 DIAGNOSIS — Z79899 Other long term (current) drug therapy: Secondary | ICD-10-CM | POA: Diagnosis not present

## 2020-01-22 DIAGNOSIS — I10 Essential (primary) hypertension: Secondary | ICD-10-CM | POA: Diagnosis not present

## 2020-01-24 DIAGNOSIS — E039 Hypothyroidism, unspecified: Secondary | ICD-10-CM | POA: Diagnosis not present

## 2020-01-24 DIAGNOSIS — I10 Essential (primary) hypertension: Secondary | ICD-10-CM | POA: Diagnosis not present

## 2020-01-24 DIAGNOSIS — Z136 Encounter for screening for cardiovascular disorders: Secondary | ICD-10-CM | POA: Diagnosis not present

## 2020-01-24 DIAGNOSIS — D696 Thrombocytopenia, unspecified: Secondary | ICD-10-CM | POA: Diagnosis not present

## 2020-01-24 DIAGNOSIS — M1990 Unspecified osteoarthritis, unspecified site: Secondary | ICD-10-CM | POA: Diagnosis not present

## 2020-04-17 DIAGNOSIS — M0609 Rheumatoid arthritis without rheumatoid factor, multiple sites: Secondary | ICD-10-CM | POA: Diagnosis not present

## 2020-04-17 DIAGNOSIS — Z79899 Other long term (current) drug therapy: Secondary | ICD-10-CM | POA: Diagnosis not present

## 2020-04-25 DIAGNOSIS — M0609 Rheumatoid arthritis without rheumatoid factor, multiple sites: Secondary | ICD-10-CM | POA: Diagnosis not present

## 2020-04-25 DIAGNOSIS — D696 Thrombocytopenia, unspecified: Secondary | ICD-10-CM | POA: Diagnosis not present

## 2020-04-25 DIAGNOSIS — Z79899 Other long term (current) drug therapy: Secondary | ICD-10-CM | POA: Diagnosis not present

## 2020-04-25 DIAGNOSIS — L80 Vitiligo: Secondary | ICD-10-CM | POA: Diagnosis not present

## 2020-04-25 DIAGNOSIS — M8949 Other hypertrophic osteoarthropathy, multiple sites: Secondary | ICD-10-CM | POA: Diagnosis not present

## 2020-07-17 DIAGNOSIS — M0609 Rheumatoid arthritis without rheumatoid factor, multiple sites: Secondary | ICD-10-CM | POA: Diagnosis not present

## 2020-07-17 DIAGNOSIS — Z79899 Other long term (current) drug therapy: Secondary | ICD-10-CM | POA: Diagnosis not present

## 2020-07-17 DIAGNOSIS — I10 Essential (primary) hypertension: Secondary | ICD-10-CM | POA: Diagnosis not present

## 2020-07-17 DIAGNOSIS — Z136 Encounter for screening for cardiovascular disorders: Secondary | ICD-10-CM | POA: Diagnosis not present

## 2020-07-17 DIAGNOSIS — E039 Hypothyroidism, unspecified: Secondary | ICD-10-CM | POA: Diagnosis not present

## 2020-07-26 DIAGNOSIS — R899 Unspecified abnormal finding in specimens from other organs, systems and tissues: Secondary | ICD-10-CM | POA: Diagnosis not present

## 2020-07-26 DIAGNOSIS — I10 Essential (primary) hypertension: Secondary | ICD-10-CM | POA: Diagnosis not present

## 2020-07-26 DIAGNOSIS — Z0000 Encounter for general adult medical examination without abnormal findings: Secondary | ICD-10-CM | POA: Diagnosis not present

## 2020-07-26 DIAGNOSIS — E039 Hypothyroidism, unspecified: Secondary | ICD-10-CM | POA: Diagnosis not present

## 2020-07-26 DIAGNOSIS — D696 Thrombocytopenia, unspecified: Secondary | ICD-10-CM | POA: Diagnosis not present

## 2020-07-31 ENCOUNTER — Ambulatory Visit: Payer: PPO | Admitting: Dermatology

## 2020-07-31 ENCOUNTER — Other Ambulatory Visit: Payer: Self-pay

## 2020-07-31 DIAGNOSIS — L820 Inflamed seborrheic keratosis: Secondary | ICD-10-CM

## 2020-07-31 DIAGNOSIS — L814 Other melanin hyperpigmentation: Secondary | ICD-10-CM | POA: Diagnosis not present

## 2020-07-31 DIAGNOSIS — L821 Other seborrheic keratosis: Secondary | ICD-10-CM

## 2020-07-31 DIAGNOSIS — D1800 Hemangioma unspecified site: Secondary | ICD-10-CM | POA: Diagnosis not present

## 2020-07-31 DIAGNOSIS — L80 Vitiligo: Secondary | ICD-10-CM | POA: Diagnosis not present

## 2020-07-31 DIAGNOSIS — L570 Actinic keratosis: Secondary | ICD-10-CM

## 2020-07-31 DIAGNOSIS — L578 Other skin changes due to chronic exposure to nonionizing radiation: Secondary | ICD-10-CM

## 2020-07-31 DIAGNOSIS — D229 Melanocytic nevi, unspecified: Secondary | ICD-10-CM | POA: Diagnosis not present

## 2020-07-31 DIAGNOSIS — Z1283 Encounter for screening for malignant neoplasm of skin: Secondary | ICD-10-CM

## 2020-07-31 NOTE — Patient Instructions (Signed)
Cryotherapy Aftercare  . Wash gently with soap and water everyday.   . Apply Vaseline and Band-Aid daily until healed. Recommend daily broad spectrum sunscreen SPF 30+ to sun-exposed areas, reapply every 2 hours as needed. Call for new or changing lesions.  

## 2020-07-31 NOTE — Progress Notes (Signed)
   Follow-Up Visit   Subjective  Mitchell Mcdowell is a 69 y.o. male who presents for the following: Annual Exam (1 year f/u TBSE, hx of AKs ) and Vitiligo (hx of Vitiligo on the trunk, exts, head, previous treated years ago with PUVA). The patient presents for Total-Body Skin Exam (TBSE) for skin cancer screening and mole check.  The following portions of the chart were reviewed this encounter and updated as appropriate:  Tobacco  Allergies  Meds  Problems  Med Hx  Surg Hx  Fam Hx     Review of Systems:  No other skin or systemic complaints except as noted in HPI or Assessment and Plan.  Objective  Well appearing patient in no apparent distress; mood and affect are within normal limits.  A full examination was performed including scalp, head, eyes, ears, nose, lips, neck, chest, axillae, abdomen, back, buttocks, bilateral upper extremities, bilateral lower extremities, hands, feet, fingers, toes, fingernails, and toenails. All findings within normal limits unless otherwise noted below.  Objective  hands, arms, face (24): Erythematous thin papules/macules with gritty scale.   Objective  trunk, exts, head: Hypopigmented patches   Objective  R forehead (2): Erythematous keratotic or waxy stuck-on papule or plaque.    Assessment & Plan  AK (actinic keratosis) (24) hands, arms, face  May consider topical cream treatment in the future   Destruction of lesion - hands, arms, face Complexity: simple   Destruction method: cryotherapy   Informed consent: discussed and consent obtained   Timeout:  patient name, date of birth, surgical site, and procedure verified Lesion destroyed using liquid nitrogen: Yes   Region frozen until ice ball extended beyond lesion: Yes   Outcome: patient tolerated procedure well with no complications   Post-procedure details: wound care instructions given    Vitiligo trunk, exts, head  Inflamed seborrheic keratosis (2) R  forehead  Destruction of lesion - R forehead Complexity: simple   Destruction method: cryotherapy   Informed consent: discussed and consent obtained   Timeout:  patient name, date of birth, surgical site, and procedure verified Lesion destroyed using liquid nitrogen: Yes   Region frozen until ice ball extended beyond lesion: Yes   Outcome: patient tolerated procedure well with no complications   Post-procedure details: wound care instructions given     Lentigines - Scattered tan macules - Discussed due to sun exposure - Benign, observe - Call for any changes  Seborrheic Keratoses - Stuck-on, waxy, tan-brown papules and plaques  - Discussed benign etiology and prognosis. - Observe - Call for any changes  Melanocytic Nevi - Tan-brown and/or pink-flesh-colored symmetric macules and papules - Benign appearing on exam today - Observation - Call clinic for new or changing moles - Recommend daily use of broad spectrum spf 30+ sunscreen to sun-exposed areas.   Hemangiomas - Red papules - Discussed benign nature - Observe - Call for any changes  Actinic Damage - diffuse scaly erythematous macules with underlying dyspigmentation - Recommend daily broad spectrum sunscreen SPF 30+ to sun-exposed areas, reapply every 2 hours as needed.  - Call for new or changing lesions.  Skin cancer screening performed today.  Return in about 4 months (around 11/30/2020).  IMarye Round, CMA, am acting as scribe for Sarina Ser, MD .  Documentation: I have reviewed the above documentation for accuracy and completeness, and I agree with the above.  Sarina Ser, MD

## 2020-08-01 ENCOUNTER — Encounter: Payer: Self-pay | Admitting: Dermatology

## 2020-10-17 DIAGNOSIS — M0609 Rheumatoid arthritis without rheumatoid factor, multiple sites: Secondary | ICD-10-CM | POA: Diagnosis not present

## 2020-10-17 DIAGNOSIS — Z79899 Other long term (current) drug therapy: Secondary | ICD-10-CM | POA: Diagnosis not present

## 2020-10-22 DIAGNOSIS — M25551 Pain in right hip: Secondary | ICD-10-CM | POA: Diagnosis not present

## 2020-10-22 DIAGNOSIS — Z79899 Other long term (current) drug therapy: Secondary | ICD-10-CM | POA: Diagnosis not present

## 2020-10-22 DIAGNOSIS — M0609 Rheumatoid arthritis without rheumatoid factor, multiple sites: Secondary | ICD-10-CM | POA: Diagnosis not present

## 2020-10-22 DIAGNOSIS — M25552 Pain in left hip: Secondary | ICD-10-CM | POA: Diagnosis not present

## 2020-10-22 DIAGNOSIS — M8949 Other hypertrophic osteoarthropathy, multiple sites: Secondary | ICD-10-CM | POA: Diagnosis not present

## 2020-10-28 DIAGNOSIS — M25552 Pain in left hip: Secondary | ICD-10-CM | POA: Diagnosis not present

## 2020-10-28 DIAGNOSIS — G8929 Other chronic pain: Secondary | ICD-10-CM | POA: Diagnosis not present

## 2020-12-04 ENCOUNTER — Ambulatory Visit: Payer: PPO | Admitting: Dermatology

## 2020-12-04 ENCOUNTER — Other Ambulatory Visit: Payer: Self-pay

## 2020-12-04 DIAGNOSIS — L570 Actinic keratosis: Secondary | ICD-10-CM | POA: Diagnosis not present

## 2020-12-04 DIAGNOSIS — L80 Vitiligo: Secondary | ICD-10-CM | POA: Diagnosis not present

## 2020-12-04 DIAGNOSIS — L578 Other skin changes due to chronic exposure to nonionizing radiation: Secondary | ICD-10-CM | POA: Diagnosis not present

## 2020-12-04 NOTE — Progress Notes (Unsigned)
Follow-Up Visit   Subjective  Mitchell Mcdowell is a 70 y.o. male who presents for the following: Actinic Keratosis (Follow up of face, arms, hands treated with LN2 - consider field treatment today.).  The following portions of the chart were reviewed this encounter and updated as appropriate:   Tobacco  Allergies  Meds  Problems  Med Hx  Surg Hx  Fam Hx     Review of Systems:  No other skin or systemic complaints except as noted in HPI or Assessment and Plan.  Objective  Well appearing patient in no apparent distress; mood and affect are within normal limits.  A focused examination was performed including face, hands. Relevant physical exam findings are noted in the Assessment and Plan.  Objective  Face: Depigmented patches  Objective  Face, hands (27): Erythematous thin papules/macules with gritty scale.    Assessment & Plan    Vitiligo Face and body Advised patient may get pink during field treatment for AKs This is a chronic persistent problem.  The patient is currently under no treatment for this and desires no treatment.. Reviewed chronic nature, no cure and can be difficult to treat.  Vitiligo is an autoimmune condition which causes loss of skin pigment and is commonly seen on the face and may also involve areas of trauma like hands, elbows, knees, and ankles.  Treatments include topical steroids and other topical anti-inflammatory ointments/creams.  Sometimes narrow band UV light therapy or Xtrac laser is helpful, both of which require twice weekly treatments for at least 3-6 months.  Antioxidant vitamins, such as Vitamins C&E, and alpha lipoic acid may be added to enhance treatment.  AK (actinic keratosis) (27) Face, hands  Severe, Confluent Chronic Actinic Changes with Pre-Cancerous Actinic Keratoses due to cumulative sun exposure/UV radiation exposure over time - Discussed Prescription "Field Treatment" Field treatment involves treatment of an entire area of  skin that has confluent Actinic Changes (Sun/ Ultraviolet light damage) and PreCancerous Actinic Keratoses by method of PhotoDynamic Therapy (PDT) and/or prescription Topical Chemotherapy agents such as 5-fluorouracil, 5-fluorouracil/calcipotriene, and/or imiquimod.  The purpose is to decrease the number of clinically evident and subclinical PreCancerous lesions to prevent progression to development of skin cancer by chemically destroying early precancer changes that may or may not be visible.  It has been shown to reduce the risk of developing skin cancer in the treated area. As a result of treatment, redness, scaling, crusting, and open sores may occur during treatment course. One or more than one of these methods may be used and may have to be used several times to control, suppress and eliminate the PreCancerous changes. Discussed treatment course, expected reaction, and possible side effects.  Will plan PDT to face on follow up.  Destruction of lesion - Face, hands Complexity: simple   Destruction method: cryotherapy   Informed consent: discussed and consent obtained   Timeout:  patient name, date of birth, surgical site, and procedure verified Lesion destroyed using liquid nitrogen: Yes   Region frozen until ice ball extended beyond lesion: Yes   Outcome: patient tolerated procedure well with no complications   Post-procedure details: wound care instructions given    Actinic Damage - chronic, secondary to cumulative UV radiation exposure/sun exposure over time - diffuse scaly erythematous macules with underlying dyspigmentation - Recommend daily broad spectrum sunscreen SPF 30+ to sun-exposed areas, reapply every 2 hours as needed.  - Call for new or changing lesions.  Return for 4 weeks for PDT to face then  3 months with Dr Nehemiah Massed.  I, Ashok Cordia, CMA, am acting as scribe for Sarina Ser, MD .  Documentation: I have reviewed the above documentation for accuracy and completeness,  and I agree with the above.  Sarina Ser, MD

## 2020-12-04 NOTE — Patient Instructions (Signed)

## 2020-12-06 ENCOUNTER — Encounter: Payer: Self-pay | Admitting: Dermatology

## 2021-01-16 ENCOUNTER — Ambulatory Visit: Payer: PPO

## 2021-01-16 ENCOUNTER — Other Ambulatory Visit: Payer: Self-pay

## 2021-01-16 DIAGNOSIS — L570 Actinic keratosis: Secondary | ICD-10-CM

## 2021-01-16 MED ORDER — AMINOLEVULINIC ACID HCL 20 % EX SOLR
1.0000 "application " | Freq: Once | CUTANEOUS | Status: AC
  Administered 2021-01-16: 354 mg via TOPICAL

## 2021-01-16 NOTE — Progress Notes (Signed)

## 2021-01-16 NOTE — Patient Instructions (Signed)

## 2021-01-20 DIAGNOSIS — Z79899 Other long term (current) drug therapy: Secondary | ICD-10-CM | POA: Diagnosis not present

## 2021-01-20 DIAGNOSIS — M0609 Rheumatoid arthritis without rheumatoid factor, multiple sites: Secondary | ICD-10-CM | POA: Diagnosis not present

## 2021-01-28 DIAGNOSIS — M1990 Unspecified osteoarthritis, unspecified site: Secondary | ICD-10-CM | POA: Diagnosis not present

## 2021-01-28 DIAGNOSIS — Z136 Encounter for screening for cardiovascular disorders: Secondary | ICD-10-CM | POA: Diagnosis not present

## 2021-01-28 DIAGNOSIS — M158 Other polyosteoarthritis: Secondary | ICD-10-CM | POA: Diagnosis not present

## 2021-01-28 DIAGNOSIS — E039 Hypothyroidism, unspecified: Secondary | ICD-10-CM | POA: Diagnosis not present

## 2021-01-28 DIAGNOSIS — I10 Essential (primary) hypertension: Secondary | ICD-10-CM | POA: Diagnosis not present

## 2021-03-05 ENCOUNTER — Ambulatory Visit: Payer: PPO | Admitting: Dermatology

## 2021-03-13 DIAGNOSIS — M160 Bilateral primary osteoarthritis of hip: Secondary | ICD-10-CM | POA: Diagnosis not present

## 2021-04-24 DIAGNOSIS — M0609 Rheumatoid arthritis without rheumatoid factor, multiple sites: Secondary | ICD-10-CM | POA: Diagnosis not present

## 2021-04-24 DIAGNOSIS — Z79899 Other long term (current) drug therapy: Secondary | ICD-10-CM | POA: Diagnosis not present

## 2021-05-01 DIAGNOSIS — M159 Polyosteoarthritis, unspecified: Secondary | ICD-10-CM | POA: Diagnosis not present

## 2021-05-01 DIAGNOSIS — D696 Thrombocytopenia, unspecified: Secondary | ICD-10-CM | POA: Diagnosis not present

## 2021-05-01 DIAGNOSIS — Z79899 Other long term (current) drug therapy: Secondary | ICD-10-CM | POA: Diagnosis not present

## 2021-05-01 DIAGNOSIS — M0609 Rheumatoid arthritis without rheumatoid factor, multiple sites: Secondary | ICD-10-CM | POA: Diagnosis not present

## 2021-05-01 DIAGNOSIS — M25552 Pain in left hip: Secondary | ICD-10-CM | POA: Diagnosis not present

## 2021-05-01 DIAGNOSIS — L80 Vitiligo: Secondary | ICD-10-CM | POA: Diagnosis not present

## 2021-05-01 DIAGNOSIS — M25551 Pain in right hip: Secondary | ICD-10-CM | POA: Diagnosis not present

## 2021-07-24 DIAGNOSIS — Z136 Encounter for screening for cardiovascular disorders: Secondary | ICD-10-CM | POA: Diagnosis not present

## 2021-07-24 DIAGNOSIS — I10 Essential (primary) hypertension: Secondary | ICD-10-CM | POA: Diagnosis not present

## 2021-07-24 DIAGNOSIS — M0609 Rheumatoid arthritis without rheumatoid factor, multiple sites: Secondary | ICD-10-CM | POA: Diagnosis not present

## 2021-07-24 DIAGNOSIS — E039 Hypothyroidism, unspecified: Secondary | ICD-10-CM | POA: Diagnosis not present

## 2021-07-24 DIAGNOSIS — Z79899 Other long term (current) drug therapy: Secondary | ICD-10-CM | POA: Diagnosis not present

## 2021-07-30 ENCOUNTER — Ambulatory Visit: Payer: PPO | Admitting: Dermatology

## 2021-07-31 DIAGNOSIS — E039 Hypothyroidism, unspecified: Secondary | ICD-10-CM | POA: Diagnosis not present

## 2021-07-31 DIAGNOSIS — D696 Thrombocytopenia, unspecified: Secondary | ICD-10-CM | POA: Insufficient documentation

## 2021-07-31 DIAGNOSIS — Z0000 Encounter for general adult medical examination without abnormal findings: Secondary | ICD-10-CM | POA: Diagnosis not present

## 2021-07-31 DIAGNOSIS — Z1159 Encounter for screening for other viral diseases: Secondary | ICD-10-CM | POA: Diagnosis not present

## 2021-07-31 DIAGNOSIS — I10 Essential (primary) hypertension: Secondary | ICD-10-CM | POA: Diagnosis not present

## 2021-08-12 ENCOUNTER — Ambulatory Visit: Payer: PPO | Admitting: Dermatology

## 2021-08-12 ENCOUNTER — Other Ambulatory Visit: Payer: Self-pay

## 2021-08-12 ENCOUNTER — Encounter: Payer: Self-pay | Admitting: Dermatology

## 2021-08-12 DIAGNOSIS — L570 Actinic keratosis: Secondary | ICD-10-CM | POA: Diagnosis not present

## 2021-08-12 DIAGNOSIS — L80 Vitiligo: Secondary | ICD-10-CM

## 2021-08-12 DIAGNOSIS — L578 Other skin changes due to chronic exposure to nonionizing radiation: Secondary | ICD-10-CM | POA: Diagnosis not present

## 2021-08-12 MED ORDER — FLUOROURACIL 5 % EX CREA: TOPICAL_CREAM | Freq: Two times a day (BID) | CUTANEOUS | 1 refills | Status: DC

## 2021-08-12 NOTE — Progress Notes (Signed)
Follow-Up Visit   Subjective  Mitchell Mcdowell is a 70 y.o. male who presents for the following: Actinic Keratosis (Face, hands, >71m f/u, PDT with ALA to face 01/16/21).  The following portions of the chart were reviewed this encounter and updated as appropriate:   Tobacco  Allergies  Meds  Problems  Med Hx  Surg Hx  Fam Hx     Review of Systems:  No other skin or systemic complaints except as noted in HPI or Assessment and Plan.  Objective  Well appearing patient in no apparent distress; mood and affect are within normal limits.  A focused examination was performed including face, ears, bil arms. Relevant physical exam findings are noted in the Assessment and Plan.  face/ears x 21 (21) Pink scaly macules   bil arms, hands, face Hypopigmented patches to arms, hands, face   Assessment & Plan   Actinic Damage - Severe, confluent actinic changes with pre-cancerous actinic keratoses  - Severe, chronic, not at goal, secondary to cumulative UV radiation exposure over time - diffuse scaly erythematous macules and papules with underlying dyspigmentation - Discussed Prescription "Field Treatment" for Severe, Chronic Confluent Actinic Changes with Pre-Cancerous Actinic Keratoses Field treatment involves treatment of an entire area of skin that has confluent Actinic Changes (Sun/ Ultraviolet light damage) and PreCancerous Actinic Keratoses by method of PhotoDynamic Therapy (PDT) and/or prescription Topical Chemotherapy agents such as 5-fluorouracil, 5-fluorouracil/calcipotriene, and/or imiquimod.  The purpose is to decrease the number of clinically evident and subclinical PreCancerous lesions to prevent progression to development of skin cancer by chemically destroying early precancer changes that may or may not be visible.  It has been shown to reduce the risk of developing skin cancer in the treated area. As a result of treatment, redness, scaling, crusting, and open sores may occur  during treatment course. One or more than one of these methods may be used and may have to be used several times to control, suppress and eliminate the PreCancerous changes. Discussed treatment course, expected reaction, and possible side effects. - Recommend daily broad spectrum sunscreen SPF 30+ to sun-exposed areas, reapply every 2 hours as needed.  - Staying in the shade or wearing long sleeves, sun glasses (UVA+UVB protection) and wide brim hats (4-inch brim around the entire circumference of the hat) are also recommended. - Call for new or changing lesions.  - Start 5-fluorouracil/calcipotriene cream twice a day for 7 days to affected areas including dorsum hands. Prescription sent to [facility]. Patient provided with contact information for pharmacy and advised the pharmacy will mail the prescription to their home. Patient provided with handout reviewing treatment course and side effects and advised to call or message [place] on MyChart with any concerns.  AK (actinic keratosis) (21) face/ears x 21  Destruction of lesion - face/ears x 21 Complexity: simple   Destruction method: cryotherapy   Informed consent: discussed and consent obtained   Timeout:  patient name, date of birth, surgical site, and procedure verified Lesion destroyed using liquid nitrogen: Yes   Region frozen until ice ball extended beyond lesion: Yes   Outcome: patient tolerated procedure well with no complications   Post-procedure details: wound care instructions given    fluorouracil (EFUDEX) 5 % cream - face/ears x 21 Apply topically 2 (two) times daily. Bid to tops of hands for 7 days  Vitiligo bil arms, hands, face  Reviewed chronic nature, no cure and can be difficult to treat.  Vitiligo is an autoimmune condition which causes loss  of skin pigment and is commonly seen on the face and may also involve areas of trauma like hands, elbows, knees, and ankles.  Treatments include topical steroids and other topical  anti-inflammatory ointments/creams.  Sometimes narrow band UV light therapy or Xtrac laser is helpful, both of which require twice weekly treatments for at least 3-6 months.  Antioxidant vitamins, such as Vitamins C&E, and alpha lipoic acid may be added to enhance treatment.  Discussed Opzelura cr, pt declines  Return in about 6 months (around 02/10/2022) for AK f/u.  I, Othelia Pulling, RMA, am acting as scribe for Sarina Ser, MD . Documentation: I have reviewed the above documentation for accuracy and completeness, and I agree with the above.  Sarina Ser, MD

## 2021-08-12 NOTE — Patient Instructions (Addendum)

## 2021-09-03 ENCOUNTER — Other Ambulatory Visit: Payer: Self-pay

## 2021-09-03 ENCOUNTER — Encounter
Admission: RE | Admit: 2021-09-03 | Discharge: 2021-09-03 | Disposition: A | Discharge status: Home / Self Care | Payer: PPO | Source: Ambulatory Visit | Attending: Orthopedic Surgery | Admitting: Orthopedic Surgery

## 2021-09-03 VITALS — BP 132/85 | HR 57 | Temp 98.2°F | Resp 16 | Ht 73.0 in | Wt 197.0 lb

## 2021-09-03 DIAGNOSIS — M1380 Other specified arthritis, unspecified site: Secondary | ICD-10-CM

## 2021-09-03 DIAGNOSIS — Z01818 Encounter for other preprocedural examination: Secondary | ICD-10-CM | POA: Insufficient documentation

## 2021-09-03 DIAGNOSIS — Z01810 Encounter for preprocedural cardiovascular examination: Secondary | ICD-10-CM | POA: Diagnosis not present

## 2021-09-03 DIAGNOSIS — Z01812 Encounter for preprocedural laboratory examination: Secondary | ICD-10-CM

## 2021-09-03 LAB — CBC WITH DIFFERENTIAL/PLATELET
Abs Immature Granulocytes: 0.02 10*3/uL (ref 0.00–0.07)
Basophils Absolute: 0 10*3/uL (ref 0.0–0.1)
Basophils Relative: 1 %
Eosinophils Absolute: 0.2 10*3/uL (ref 0.0–0.5)
Eosinophils Relative: 4 %
HCT: 41.8 % (ref 39.0–52.0)
Hemoglobin: 15.1 g/dL (ref 13.0–17.0)
Immature Granulocytes: 0 %
Lymphocytes Relative: 20 %
Lymphs Abs: 1.1 10*3/uL (ref 0.7–4.0)
MCH: 34.5 pg — ABNORMAL HIGH (ref 26.0–34.0)
MCHC: 36.1 g/dL — ABNORMAL HIGH (ref 30.0–36.0)
MCV: 95.4 fL (ref 80.0–100.0)
Monocytes Absolute: 0.6 10*3/uL (ref 0.1–1.0)
Monocytes Relative: 11 %
Neutro Abs: 3.5 10*3/uL (ref 1.7–7.7)
Neutrophils Relative %: 64 %
Platelets: 154 10*3/uL (ref 150–400)
RBC: 4.38 MIL/uL (ref 4.22–5.81)
RDW: 13 % (ref 11.5–15.5)
WBC: 5.5 10*3/uL (ref 4.0–10.5)
nRBC: 0 % (ref 0.0–0.2)

## 2021-09-03 LAB — URINALYSIS, ROUTINE W REFLEX MICROSCOPIC
Bilirubin Urine: NEGATIVE
Glucose, UA: NEGATIVE mg/dL
Hgb urine dipstick: NEGATIVE
Ketones, ur: 5 mg/dL — AB
Leukocytes,Ua: NEGATIVE
Nitrite: NEGATIVE
Protein, ur: NEGATIVE mg/dL
Specific Gravity, Urine: 1.023 (ref 1.005–1.030)
pH: 6 (ref 5.0–8.0)

## 2021-09-03 LAB — COMPREHENSIVE METABOLIC PANEL
ALT: 21 U/L (ref 0–44)
AST: 18 U/L (ref 15–41)
Albumin: 4.3 g/dL (ref 3.5–5.0)
Alkaline Phosphatase: 51 U/L (ref 38–126)
Anion gap: 8 (ref 5–15)
BUN: 13 mg/dL (ref 8–23)
CO2: 24 mmol/L (ref 22–32)
Calcium: 9.2 mg/dL (ref 8.9–10.3)
Chloride: 105 mmol/L (ref 98–111)
Creatinine, Ser: 0.87 mg/dL (ref 0.61–1.24)
GFR, Estimated: >60 mL/min (ref >60)
Glucose, Bld: 111 mg/dL — ABNORMAL HIGH (ref 70–99)
Potassium: 3.5 mmol/L (ref 3.5–5.1)
Sodium: 137 mmol/L (ref 135–145)
Total Bilirubin: 1.1 mg/dL (ref 0.3–1.2)
Total Protein: 7.6 g/dL (ref 6.5–8.1)

## 2021-09-03 LAB — TYPE AND SCREEN
ABO/RH(D): O POS
Antibody Screen: NEGATIVE

## 2021-09-03 LAB — APTT: aPTT: 31 seconds (ref 24–36)

## 2021-09-03 LAB — PROTIME-INR
INR: 1.1 (ref 0.8–1.2)
Prothrombin Time: 14 seconds (ref 11.4–15.2)

## 2021-09-03 LAB — C-REACTIVE PROTEIN: CRP: 0.5 mg/dL (ref <1.0)

## 2021-09-03 LAB — SURGICAL PCR SCREEN
MRSA, PCR: NEGATIVE
Staphylococcus aureus: NEGATIVE

## 2021-09-03 LAB — SEDIMENTATION RATE: Sed Rate: 8 mm/hr (ref 0–20)

## 2021-09-03 NOTE — Discharge Instructions (Signed)
Instructions after Total Hip Replacement     Jocee Kissick P. Zona Pedro, Jr., M.D.     Dept. of Orthopaedics & Sports Medicine  [facility]  [address]  [place], [place]  [address]  Phone: [phone]   Fax: [phone]    DIET: . Drink plenty of non-alcoholic fluids. . Resume your normal diet. Include foods high in fiber.  ACTIVITY:  . You may use crutches or a walker with weight-bearing as tolerated, unless instructed otherwise. . You may be weaned off of the walker or crutches by your Physical Therapist.  . Do NOT reach below the level of your knees or cross your legs until allowed.    . Continue doing gentle exercises. Exercising will reduce the pain and swelling, increase motion, and prevent muscle weakness.   . Please continue to use the TED compression stockings for 6 weeks. You may remove the stockings at night, but should reapply them in the morning. . Do not drive or operate any equipment until instructed.  WOUND CARE:  . Continue to use ice packs periodically to reduce pain and swelling. . Keep the incision clean and dry. . You may bathe or shower after the staples are removed at the first office visit following surgery.  MEDICATIONS: . You may resume your regular medications. . Please take the pain medication as prescribed on the medication. . Do not take pain medication on an empty stomach. . You have been given a prescription for a blood thinner to prevent blood clots. Please take the medication as instructed. (NOTE: After completing a 2 week course of Lovenox, take one Enteric-coated aspirin once a day.) . Pain medications and iron supplements can cause constipation. Use a stool softener (Senokot or Colace) on a daily basis and a laxative (dulcolax or miralax) as needed. . Do not drive or drink alcoholic beverages when taking pain medications.  CALL THE OFFICE FOR: . Temperature above 101 degrees . Excessive bleeding or drainage on the dressing. . Excessive  swelling, coldness, or paleness of the toes. . Persistent nausea and vomiting.  FOLLOW-UP:  . You should have an appointment to return to the office in 6 weeks after surgery. . Arrangements have been made for continuation of Physical Therapy (either home therapy or outpatient therapy).     [facility] Department Directory         [email or web address]       [email or web address]          Cardiology  Appointments: [place] - [phone] [place] - [phone]  Endocrinology  Appointments: [place] - [phone] [place] - [phone]  Gastroenterology  Appointments: [place] - [phone] [place] - [phone]        General Surgery   Appointments: [place] - [phone]  Internal Medicine/Family Medicine  Appointments: [place] - [phone] [place] - [phone] [place] - [phone]  Metabolic and Weigh Loss Surgery  Appointments: [place] - [phone]        Neurology  Appointments: [place] - [phone] [place] - [phone]  Neurosurgery  Appointments: [place] - [phone]  Obstetrics & Gynecology  Appointments: [place] - [phone] [place] - [phone]        Pediatrics  Appointments: [place] - [phone] [place] - [phone]  Physiatry  Appointments: [place] -[phone]  Physical Therapy  Appointments: [place] - [phone] [place] - [phone]        Podiatry  Appointments: [place] - [phone] [place] - [phone]  Pulmonology  Appointments: [place] - [phone]  Rheumatology  Appointments:  - [phone]         Location: [facility]  [address] [place], [place]  [address]  [place] Location: [facility] [address] [place], [place]  [address]  [place] Location: [facility] [address] [place],   [address]    

## 2021-09-03 NOTE — Patient Instructions (Addendum)
Your procedure is scheduled on: Wednesday September 10, 2021. Report to Day Surgery inside [facility] 2nd floor. To find out your arrival time please call [phone] between 1PM - 3PM on Tuesday September 09, 2021.  Remember: Instructions that are not followed completely may result in serious medical risk,  up to and including death, or upon the discretion of your surgeon and anesthesiologist your  surgery may need to be rescheduled.     _X__ 1. Do not eat food after midnight the night before your procedure.                 No chewing gum or hard candies. You may drink clear liquids up to 2 hours                 before you are scheduled to arrive for your surgery- DO not drink clear                 liquids within 2 hours of the start of your surgery.                 Clear Liquids include:  water, apple juice without pulp, clear Gatorade, G2 or                  Gatorade Zero (avoid Red/Purple/Blue), Black Coffee or Tea (Do not add                 anything to coffee or tea).  __X__2.   Complete the "Ensure Clear Pre-surgery Clear Carbohydrate Drink" provided to you, 2 hours before arrival. **If you are diabetic you will be provided with an alternative drink, Gatorade Zero or G2.  __X__3.  On the morning of surgery brush your teeth with toothpaste and water, you                may rinse your mouth with mouthwash if you wish.  Do not swallow any toothpaste of mouthwash.     _X__ 4.  No Alcohol for 24 hours before or after surgery.   _X__ 5.  Do Not Smoke or use e-cigarettes For 24 Hours Prior to Your Surgery.                 Do not use any chewable tobacco products for at least 6 hours prior to                 Surgery.  _X__  6.  Do not use any recreational drugs (marijuana, cocaine, heroin, ecstasy, MDMA or other)                For at least one week prior to your surgery.  Combination of these drugs with anesthesia                May have life threatening  results.  __X__  7.  Notify your doctor if there is any change in your medical condition      (cold, fever, infections).     Do not wear jewelry, make-up, hairpins, clips or nail polish. Do not wear lotions, powders, or perfumes. You may wear deodorant. Do not shave 48 hours prior to surgery. Men may shave face and neck. Do not bring valuables to the hospital.    [facility] is not responsible for any belongings or valuables.  Contacts, dentures or bridgework may not be worn into surgery. Leave your suitcase in the car. After surgery it may be brought to your room. For  patients admitted to the hospital, discharge time is determined by your treatment team.   Patients discharged the day of surgery will not be allowed to drive home.   Make arrangements for someone to be with you for the first 24 hours of your Same Day Discharge.    Please read over the following fact sheets that you were given:   Total Joint Packet    __X__ Take these medicines the morning of surgery with A SIP OF WATER:    1. levothyroxine (SYNTHROID, LEVOTHROID) 175 MCG  2. methotrexate (RHEUMATREX) 10 MG  3.   4.  5.  6.  ____ Fleet Enema (as directed)   __X__ Use CHG Soap (or wipes) as directed  ____ Use Benzoyl Peroxide Gel as instructed  ____ Use inhalers on the day of surgery  ____ Stop metformin 2 days prior to surgery    ____ Take 1/2 of usual insulin dose the night before surgery. No insulin the morning          of surgery.   ____ Call your PCP, cardiologist, or Pulmonologist if taking Coumadin/Plavix/aspirin and ask when to stop before your surgery.   __X__ One Week prior to surgery- Stop Anti-inflammatories such as Ibuprofen, Aleve, Advil, Motrin, meloxicam (MOBIC), diclofenac, etodolac, ketorolac, Toradol, Daypro, piroxicam, Goody's or BC powders. OK TO USE TYLENOL IF NEEDED   __X__ Stop supplements until after surgery. folic acid (FOLVITE) 1 MG   ____ Bring C-Pap to the hospital.     If you have any questions regarding your pre-procedure instructions,  Please call Pre-admit Testing at [phone].

## 2021-09-04 DIAGNOSIS — M1612 Unilateral primary osteoarthritis, left hip: Secondary | ICD-10-CM | POA: Diagnosis not present

## 2021-09-08 ENCOUNTER — Other Ambulatory Visit
Admission: RE | Admit: 2021-09-08 | Discharge: 2021-09-08 | Disposition: A | Discharge status: Home / Self Care | Payer: PPO | Source: Ambulatory Visit | Attending: Orthopedic Surgery | Admitting: Orthopedic Surgery

## 2021-09-08 ENCOUNTER — Other Ambulatory Visit: Payer: Self-pay

## 2021-09-08 DIAGNOSIS — Z20822 Contact with and (suspected) exposure to covid-19: Secondary | ICD-10-CM | POA: Insufficient documentation

## 2021-09-08 DIAGNOSIS — Z01812 Encounter for preprocedural laboratory examination: Secondary | ICD-10-CM | POA: Diagnosis not present

## 2021-09-08 NOTE — H&P (Signed)
ORTHOPAEDIC HISTORY & PHYSICAL Mitchell Mcdowell, [place] - 09/04/2021 4:00 PM EDT Formatting of this note is different from the original. [place] Chief Complaint:   Chief Complaint  Patient presents with   Hip Pain  H & P LEFT HIP   History of Present Illness:   Mitchell Mcdowell is a 70 y.o. male that presents to clinic today for his preoperative history and evaluation. Patient presents with his wife. The patient is scheduled to undergo a left total hip arthroplasty on 09/10/21 by Dr. Marry Guan. His pain began around 1 year ago. The pain is located in the left hip and groin. He describes his pain as worse with weightbearing and aggravated by walking. He reports associated decrease in his hip range of motion as well as difficulty with putting on his shoes and socks. He denies associated numbness or tingling.   The patient's symptoms have progressed to the point that they decrease his quality of life. The patient has previously undergone conservative treatment including NSAIDS and activity modification without adequate control of his symptoms.  Denies history of blood clots, significant cardiac history, or lumbar surgery.  Patient does have a history of rheumatoid arthritis for which he takes Humira and methotrexate. He took the last dose of Humira yesterday 09/03/21.  Past Medical, Surgical, Family, Social History, Allergies, Medications:   Past Medical History:  Past Medical History:  Diagnosis Date   Allergic state   Anemia 2017   History of colon polyps   Hypertension   Hypothyroidism  with history of papillary carcinoma.   Impotence   Inflammatory arthritis  a. Negative rheumatoid factor, negative anti-CCP antibodies. b. S/p prednisone. c. Methotrexate. d. Enbrel. ([place])   Osteoarthritis  a. Hands. b. Shoulders. [facility])   Papillary carcinoma (CMS-HCC)   Pill gastritis 01/20/2016   Vitiligo   Past Surgical History:  Past Surgical  History:  Procedure Laterality Date   CATARACT EXTRACTION Right  Nov 23rd Dr. [facility]   COLONOSCOPY N/A 01/20/2016  (01/02/2013) Dr. Mamie Nick. Oh @ [place] - Diverticulosis, PHPolyps, rpt 5 yrs per [facility]   COLONOSCOPY N/A 01/24/2007  Dr. Ivor Messier @ [facility] - Adenomatous Polyps   EGD N/A 01/20/2016  Dr. Mamie Nick. Oh @ [place] - Iron Pill gastritis/No Repeat/[facility]   HERNIA REPAIR   KNEE ARTHROSCOPY   Nodule removed from thyroid due to thyroid carcinoma.   Right ACL tear.   VASECTOMY   Current Medications:  Current Outpatient Medications  Medication Sig Dispense Refill   adalimumab (HUMIRA) 40 mg/0.4 mL pen injector kit Inject 40 mg subcutaneously every 14 (fourteen) days 3 kit 2   ferrous sulfate 325 (65 FE) MG tablet Take 325 mg by mouth 2 (two) times daily.   fluorouraciL (EFUDEX) 5 % cream Apply topically 2 (two) times daily FOR 7 DAYS   fluticasone (FLONASE) 50 mcg/actuation nasal spray Use 2 sprays in each nostril once a day 48 g 1   folic acid (FOLVITE) 1 MG tablet TAKE 1 TABLET BY MOUTH EVERY DAY 90 tablet 3   hydroCHLOROthiazide (HYDRODIURIL) 25 MG tablet TAKE 1 TABLET BY MOUTH EVERY DAY 90 tablet 1   ibuprofen (ADVIL,MOTRIN) 200 MG tablet Take 200 mg by mouth as needed for Pain (as needed).   levothyroxine (SYNTHROID) 175 MCG tablet Take 1 tablet (175 mcg total) by mouth once daily Take on an empty stomach with a glass of water at least 30-60 minutes before breakfast. 90 tablet 1   loratadine (  CLARITIN) 10 mg tablet Take 10 mg by mouth as needed.   methotrexate (RHEUMATREX) 2.5 MG tablet TAKE 4 TABLETS BY MOUTH EVERY 7 DAYS. 48 tablet 2   sildenafiL (VIAGRA) 50 MG tablet TAKE 1 TABLET (50 MG TOTAL) BY MOUTH ONCE DAILY AS NEEDED FOR ERECTILE DYSFUNCTION 4 tablet 7   tetrahydrozoline (VISINE) 0.05 % ophthalmic solution Place 1 drop into both eyes 2 (two) times daily   No current facility-administered medications for this visit.   Allergies: No Known Allergies  Social History:  Social History    Socioeconomic History   Marital status: Married  Spouse name: [name]   Number of children: 2   Years of education: 12   Highest education level: High school graduate  Occupational History   Occupation: Retired- Patent attorney & [employer]  Tobacco Use   Smoking status: Never Smoker   Smokeless tobacco: Never Used  Scientific laboratory technician Use: Never used  Substance and Sexual Activity   Alcohol use: Yes  Alcohol/week: 12.0 standard drinks  Types: 6 Glasses of wine, 6 Cans of beer per week  Comment: 6 beers a week   Drug use: No   Sexual activity: Yes  Partners: Female  Birth control/protection: Other-see comments  Comment: Vasectomy  Social History Narrative  Marital status- Married  Lives with wife  Employment- Surveyor, minerals (Programmer, systems)  Supplements- Folic Acid  Exercise hx- Occasional- walks, plays golf  Religious affliation- None   Family History:  Family History  Problem Relation Age of Onset   Myocardial Infarction (Heart attack) Father   Stroke Father   Diabetes type II Father   Diabetes type II Mother   Stroke Mother   Review of Systems:   A 10+ ROS was performed, reviewed, and the pertinent orthopaedic findings are documented in the HPI.   Physical Examination:   BP (!) 150/100 (BP Location: Left upper arm, Patient Position: Sitting, BP Cuff Size: Adult)  Ht 185.4 cm ('6\' 1"' )  Wt 88.6 kg (195 lb 6.4 oz)  BMI 25.78 kg/m   Patient is a well-developed, well-nourished male in no acute distress. Patient has normal mood and affect. Patient is alert and oriented to person, place, and time.   HEENT: Atraumatic, normocephalic. Pupils equal and reactive to light. Extraocular motion intact. Noninjected sclera.  Cardiovascular: Regular rate and rhythm, with no murmurs, rubs, or gallops. Distal pulses palpable.  Respiratory: Lungs clear to auscultation bilaterally.     Left Hip: Soft tissue swelling: Negative Erythema: Negative Crepitance:  Positive Tenderness: Greater trochanter is nontender to palpation. Moderate pain is elicited by axial compression or extremes of rotation. Atrophy: No atrophy. Fair to good hip flexor and abductor strength. Range of Motion: EXT/FLEX: 10/0/100 ADD/ABD: 20/0/20 IR/ER: 0/0/20  Sensation is intact over the saphenous, lateral cutaneous, superficial fibular, and deep fibular nerve distributions.  Tests Performed/Reviewed:  X-rays  Anteroposterior view of the pelvis as well as anteroposterior and lateral views of the left hip were obtained. Images reveal severe loss of femoroacetabular joint space with significant osteophyte formation and deformation of the femoral head. No fractures or osseous abnormality noted.   I personally ordered and interpreted the xrays.  Impression:   ICD-10-CM  1. Primary osteoarthritis of left hip M16.12   Plan:   The patient has end-stage degenerative changes of the left hip. It was explained to the patient that the condition is progressive in nature. Having failed conservative treatment, the patient has elected to proceed with a total joint  arthroplasty. The patient will undergo a total joint arthroplasty with Dr. Marry Guan. The risks of surgery, including blood clot and infection, were discussed with the patient. Measures to reduce these risks, including the use of anticoagulation, perioperative antibiotics, and early ambulation were discussed. The importance of postoperative physical therapy was discussed with the patient. The patient elects to proceed with surgery. The patient is instructed to stop all blood thinners prior to surgery. The patient is instructed to call the hospital the day before surgery to learn of the proper arrival time.   Contact our office with any questions or concerns. Follow up as indicated, or sooner should any new problems arise, if conditions worsen, or if they are otherwise concerned.   Mitchell Fudge, PA-C [facility] [place], [place] [address] Phone: [phone]  This note was generated in part with voice recognition software and I apologize for any typographical errors that were not detected and corrected.  Electronically signed by Mitchell Fudge, PA at 09/04/2021 6:46 PM EDT

## 2021-09-09 LAB — SARS CORONAVIRUS 2 (TAT 6-24 HRS): SARS Coronavirus 2: NEGATIVE

## 2021-09-10 ENCOUNTER — Observation Stay
Admission: RE | Admit: 2021-09-10 | Discharge: 2021-09-11 | Disposition: A | Discharge status: Home / Self Care | Payer: PPO | Attending: Orthopedic Surgery | Admitting: Orthopedic Surgery

## 2021-09-10 ENCOUNTER — Encounter
Admission: RE | Disposition: A | Discharge status: Home / Self Care | Payer: Self-pay | Source: Home / Self Care | Attending: Orthopedic Surgery

## 2021-09-10 ENCOUNTER — Observation Stay: Payer: PPO

## 2021-09-10 ENCOUNTER — Encounter: Payer: Self-pay | Admitting: Orthopedic Surgery

## 2021-09-10 ENCOUNTER — Other Ambulatory Visit: Payer: Self-pay

## 2021-09-10 ENCOUNTER — Ambulatory Visit: Payer: PPO | Admitting: Urgent Care

## 2021-09-10 ENCOUNTER — Ambulatory Visit: Payer: PPO | Admitting: Registered Nurse

## 2021-09-10 DIAGNOSIS — Z96649 Presence of unspecified artificial hip joint: Secondary | ICD-10-CM

## 2021-09-10 DIAGNOSIS — M1612 Unilateral primary osteoarthritis, left hip: Principal | ICD-10-CM | POA: Insufficient documentation

## 2021-09-10 DIAGNOSIS — Z96642 Presence of left artificial hip joint: Secondary | ICD-10-CM

## 2021-09-10 DIAGNOSIS — Z79899 Other long term (current) drug therapy: Secondary | ICD-10-CM | POA: Insufficient documentation

## 2021-09-10 DIAGNOSIS — I10 Essential (primary) hypertension: Secondary | ICD-10-CM | POA: Insufficient documentation

## 2021-09-10 DIAGNOSIS — Z471 Aftercare following joint replacement surgery: Secondary | ICD-10-CM | POA: Diagnosis not present

## 2021-09-10 DIAGNOSIS — E039 Hypothyroidism, unspecified: Secondary | ICD-10-CM | POA: Diagnosis not present

## 2021-09-10 DIAGNOSIS — M1380 Other specified arthritis, unspecified site: Secondary | ICD-10-CM

## 2021-09-10 HISTORY — PX: TOTAL HIP ARTHROPLASTY: SHX124

## 2021-09-10 LAB — ABO/RH: ABO/RH(D): O POS

## 2021-09-10 SURGERY — ARTHROPLASTY, HIP, TOTAL,POSTERIOR APPROACH
Anesthesia: General | Site: Hip | Laterality: Left

## 2021-09-10 MED ORDER — PANTOPRAZOLE SODIUM 40 MG PO TBEC
40.0000 mg | DELAYED_RELEASE_TABLET | Freq: Two times a day (BID) | ORAL | Status: DC
  Administered 2021-09-10 – 2021-09-11 (×2): 40 mg via ORAL
  Filled 2021-09-10 (×3): qty 1

## 2021-09-10 MED ORDER — SODIUM CHLORIDE 0.9 % IR SOLN
Status: DC | PRN
  Administered 2021-09-10: 3000 mL

## 2021-09-10 MED ORDER — CHLORHEXIDINE GLUCONATE 0.12 % MT SOLN
OROMUCOSAL | Status: AC
  Administered 2021-09-10: 15 mL via OROMUCOSAL
  Filled 2021-09-10: qty 15

## 2021-09-10 MED ORDER — MAGNESIUM HYDROXIDE 400 MG/5ML PO SUSP: 30.0000 mL | Freq: Every day | ORAL | Status: DC

## 2021-09-10 MED ORDER — ALUM & MAG HYDROXIDE-SIMETH 200-200-20 MG/5ML PO SUSP: 30.0000 mL | ORAL | Status: DC | PRN

## 2021-09-10 MED ORDER — OXYCODONE HCL 5 MG PO TABS
ORAL_TABLET | ORAL | Status: AC
  Filled 2021-09-10: qty 2

## 2021-09-10 MED ORDER — OXYCODONE HCL 5 MG PO TABS
10.0000 mg | ORAL_TABLET | ORAL | Status: DC | PRN
  Administered 2021-09-10 – 2021-09-11 (×3): 10 mg via ORAL

## 2021-09-10 MED ORDER — GABAPENTIN 300 MG PO CAPS: 300.0000 mg | ORAL_CAPSULE | Freq: Once | ORAL | Status: AC

## 2021-09-10 MED ORDER — EPHEDRINE SULFATE 50 MG/ML IJ SOLN
INTRAMUSCULAR | Status: DC | PRN
  Administered 2021-09-10 (×2): 10 mg via INTRAVENOUS
  Administered 2021-09-10: 5 mg via INTRAVENOUS
  Administered 2021-09-10: 10 mg via INTRAVENOUS

## 2021-09-10 MED ORDER — FLEET ENEMA 7-19 GM/118ML RE ENEM: 1.0000 | ENEMA | Freq: Once | RECTAL | Status: DC | PRN

## 2021-09-10 MED ORDER — CHLORHEXIDINE GLUCONATE 4 % EX LIQD: 60.0000 mL | Freq: Once | CUTANEOUS | Status: DC

## 2021-09-10 MED ORDER — ARTIFICIAL TEARS OPHTHALMIC OINT
TOPICAL_OINTMENT | OPHTHALMIC | Status: AC
  Filled 2021-09-10: qty 3.5

## 2021-09-10 MED ORDER — PRONTOSAN WOUND IRRIGATION OPTIME
TOPICAL | Status: DC | PRN
  Administered 2021-09-10: 1

## 2021-09-10 MED ORDER — CHLORHEXIDINE GLUCONATE 0.12 % MT SOLN: 15.0000 mL | Freq: Once | OROMUCOSAL | Status: AC

## 2021-09-10 MED ORDER — BISACODYL 10 MG RE SUPP
10.0000 mg | Freq: Every day | RECTAL | Status: DC | PRN
  Filled 2021-09-10: qty 1

## 2021-09-10 MED ORDER — CEFAZOLIN SODIUM-DEXTROSE 2-4 GM/100ML-% IV SOLN
INTRAVENOUS | Status: AC
  Administered 2021-09-10: 2 g via INTRAVENOUS
  Filled 2021-09-10: qty 100

## 2021-09-10 MED ORDER — CEFAZOLIN SODIUM-DEXTROSE 2-4 GM/100ML-% IV SOLN
INTRAVENOUS | Status: AC
  Administered 2021-09-11: 2 g
  Filled 2021-09-10: qty 100

## 2021-09-10 MED ORDER — TRANEXAMIC ACID-NACL 1000-0.7 MG/100ML-% IV SOLN
INTRAVENOUS | Status: AC
  Administered 2021-09-10: 1000 mg via INTRAVENOUS
  Filled 2021-09-10: qty 100

## 2021-09-10 MED ORDER — ACETAMINOPHEN 10 MG/ML IV SOLN
INTRAVENOUS | Status: DC | PRN
Start: 2021-09-10 — End: 2021-09-10
  Administered 2021-09-10: 1000 mg via INTRAVENOUS

## 2021-09-10 MED ORDER — ONDANSETRON HCL 4 MG/2ML IJ SOLN
INTRAMUSCULAR | Status: AC
  Filled 2021-09-10: qty 2

## 2021-09-10 MED ORDER — HYDROMORPHONE HCL 1 MG/ML IJ SOLN: 0.5000 mg | INTRAMUSCULAR | Status: DC | PRN

## 2021-09-10 MED ORDER — TRANEXAMIC ACID-NACL 1000-0.7 MG/100ML-% IV SOLN
INTRAVENOUS | Status: AC
  Filled 2021-09-10: qty 100

## 2021-09-10 MED ORDER — ONDANSETRON HCL 4 MG/2ML IJ SOLN: 4.0000 mg | Freq: Once | INTRAMUSCULAR | Status: DC | PRN

## 2021-09-10 MED ORDER — ACETAMINOPHEN 10 MG/ML IV SOLN
INTRAVENOUS | Status: AC
  Filled 2021-09-10: qty 100

## 2021-09-10 MED ORDER — PROPOFOL 500 MG/50ML IV EMUL
INTRAVENOUS | Status: DC | PRN
  Administered 2021-09-10: 100 ug/kg/min via INTRAVENOUS
  Administered 2021-09-10: 100 ug via INTRAVENOUS

## 2021-09-10 MED ORDER — MIDAZOLAM HCL 5 MG/5ML IJ SOLN
INTRAMUSCULAR | Status: DC | PRN
  Administered 2021-09-10 (×2): 1 mg via INTRAVENOUS

## 2021-09-10 MED ORDER — PHENYLEPHRINE HCL (PRESSORS) 10 MG/ML IV SOLN
INTRAVENOUS | Status: DC | PRN
Start: 2021-09-10 — End: 2021-09-10
  Administered 2021-09-10: 200 ug via INTRAVENOUS
  Administered 2021-09-10: 100 ug via INTRAVENOUS
  Administered 2021-09-10: 200 ug via INTRAVENOUS

## 2021-09-10 MED ORDER — FENTANYL CITRATE (PF) 100 MCG/2ML IJ SOLN
INTRAMUSCULAR | Status: AC
  Filled 2021-09-10: qty 2

## 2021-09-10 MED ORDER — PHENYLEPHRINE HCL-NACL 20-0.9 MG/250ML-% IV SOLN
INTRAVENOUS | Status: DC | PRN
  Administered 2021-09-10: 20 ug/min via INTRAVENOUS

## 2021-09-10 MED ORDER — FENTANYL CITRATE (PF) 100 MCG/2ML IJ SOLN
INTRAMUSCULAR | Status: AC
  Administered 2021-09-10: 25 ug via INTRAVENOUS
  Filled 2021-09-10: qty 2

## 2021-09-10 MED ORDER — NEOMYCIN-POLYMYXIN B GU 40-200000 IR SOLN
Status: AC
  Filled 2021-09-10: qty 2

## 2021-09-10 MED ORDER — GABAPENTIN 300 MG PO CAPS
ORAL_CAPSULE | ORAL | Status: AC
  Administered 2021-09-10: 300 mg via ORAL
  Filled 2021-09-10: qty 1

## 2021-09-10 MED ORDER — ACETAMINOPHEN 325 MG PO TABS: 325.0000 mg | ORAL_TABLET | Freq: Four times a day (QID) | ORAL | Status: DC | PRN

## 2021-09-10 MED ORDER — PROPOFOL 1000 MG/100ML IV EMUL
INTRAVENOUS | Status: AC
  Filled 2021-09-10: qty 200

## 2021-09-10 MED ORDER — MENTHOL 3 MG MT LOZG
1.0000 | LOZENGE | OROMUCOSAL | Status: DC | PRN
  Filled 2021-09-10: qty 9

## 2021-09-10 MED ORDER — FLUTICASONE PROPIONATE 50 MCG/ACT NA SUSP
2.0000 | Freq: Every day | NASAL | Status: DC
  Administered 2021-09-10 – 2021-09-11 (×2): 2 via NASAL
  Filled 2021-09-10: qty 16

## 2021-09-10 MED ORDER — HYDROCHLOROTHIAZIDE 12.5 MG PO CAPS
12.5000 mg | ORAL_CAPSULE | Freq: Every day | ORAL | Status: DC
  Administered 2021-09-10 – 2021-09-11 (×2): 12.5 mg via ORAL
  Filled 2021-09-10 (×2): qty 1

## 2021-09-10 MED ORDER — PHENOL 1.4 % MT LIQD
1.0000 | OROMUCOSAL | Status: DC | PRN
  Filled 2021-09-10: qty 177

## 2021-09-10 MED ORDER — FAMOTIDINE 20 MG PO TABS: 20.0000 mg | ORAL_TABLET | Freq: Once | ORAL | Status: AC

## 2021-09-10 MED ORDER — LACTATED RINGERS IV SOLN: INTRAVENOUS | Status: DC

## 2021-09-10 MED ORDER — DEXAMETHASONE SODIUM PHOSPHATE 10 MG/ML IJ SOLN: 8.0000 mg | Freq: Once | INTRAMUSCULAR | Status: AC

## 2021-09-10 MED ORDER — FENTANYL CITRATE (PF) 100 MCG/2ML IJ SOLN: 25.0000 ug | INTRAMUSCULAR | Status: DC | PRN

## 2021-09-10 MED ORDER — FOLIC ACID 1 MG PO TABS
1.0000 mg | ORAL_TABLET | Freq: Every day | ORAL | Status: DC
  Administered 2021-09-10 – 2021-09-11 (×2): 1 mg via ORAL
  Filled 2021-09-10 (×2): qty 1

## 2021-09-10 MED ORDER — TRANEXAMIC ACID-NACL 1000-0.7 MG/100ML-% IV SOLN: 1000.0000 mg | Freq: Once | INTRAVENOUS | Status: AC

## 2021-09-10 MED ORDER — MIDAZOLAM HCL 2 MG/2ML IJ SOLN
INTRAMUSCULAR | Status: AC
  Filled 2021-09-10: qty 2

## 2021-09-10 MED ORDER — LORATADINE 10 MG PO TABS
10.0000 mg | ORAL_TABLET | Freq: Every day | ORAL | Status: DC
  Administered 2021-09-10 – 2021-09-11 (×2): 10 mg via ORAL
  Filled 2021-09-10 (×2): qty 1

## 2021-09-10 MED ORDER — SODIUM CHLORIDE 0.9 % IV SOLN: INTRAVENOUS | Status: DC

## 2021-09-10 MED ORDER — ENSURE PRE-SURGERY PO LIQD
296.0000 mL | Freq: Once | ORAL | Status: DC
  Filled 2021-09-10: qty 296

## 2021-09-10 MED ORDER — SUCCINYLCHOLINE CHLORIDE 200 MG/10ML IV SOSY
PREFILLED_SYRINGE | INTRAVENOUS | Status: AC
  Filled 2021-09-10: qty 10

## 2021-09-10 MED ORDER — PHENYLEPHRINE HCL-NACL 20-0.9 MG/250ML-% IV SOLN
INTRAVENOUS | Status: AC
  Filled 2021-09-10: qty 250

## 2021-09-10 MED ORDER — LEVOTHYROXINE SODIUM 175 MCG PO TABS
175.0000 ug | ORAL_TABLET | Freq: Every day | ORAL | Status: DC
  Administered 2021-09-11: 175 ug via ORAL
  Filled 2021-09-10 (×2): qty 1

## 2021-09-10 MED ORDER — TRANEXAMIC ACID-NACL 1000-0.7 MG/100ML-% IV SOLN
1000.0000 mg | INTRAVENOUS | Status: AC
  Administered 2021-09-10: 1000 mg via INTRAVENOUS

## 2021-09-10 MED ORDER — EPHEDRINE 5 MG/ML INJ
INTRAVENOUS | Status: AC
  Filled 2021-09-10: qty 5

## 2021-09-10 MED ORDER — FAMOTIDINE 20 MG PO TABS
ORAL_TABLET | ORAL | Status: AC
  Administered 2021-09-10: 20 mg via ORAL
  Filled 2021-09-10: qty 1

## 2021-09-10 MED ORDER — CEFAZOLIN SODIUM-DEXTROSE 2-4 GM/100ML-% IV SOLN: 2.0000 g | Freq: Four times a day (QID) | INTRAVENOUS | Status: AC

## 2021-09-10 MED ORDER — CELECOXIB 200 MG PO CAPS
200.0000 mg | ORAL_CAPSULE | Freq: Two times a day (BID) | ORAL | Status: DC
  Administered 2021-09-10 – 2021-09-11 (×2): 200 mg via ORAL

## 2021-09-10 MED ORDER — METOCLOPRAMIDE HCL 10 MG PO TABS
10.0000 mg | ORAL_TABLET | Freq: Three times a day (TID) | ORAL | Status: DC
  Administered 2021-09-11: 10 mg via ORAL

## 2021-09-10 MED ORDER — POLYVINYL ALCOHOL 1.4 % OP SOLN
1.0000 [drp] | Freq: Four times a day (QID) | OPHTHALMIC | Status: DC | PRN
  Filled 2021-09-10: qty 15

## 2021-09-10 MED ORDER — ONDANSETRON HCL 4 MG/2ML IJ SOLN
INTRAMUSCULAR | Status: DC | PRN
  Administered 2021-09-10: 4 mg via INTRAVENOUS

## 2021-09-10 MED ORDER — ORAL CARE MOUTH RINSE: 15.0000 mL | Freq: Once | OROMUCOSAL | Status: AC

## 2021-09-10 MED ORDER — CELECOXIB 200 MG PO CAPS
ORAL_CAPSULE | ORAL | Status: AC
  Administered 2021-09-10: 400 mg via ORAL
  Filled 2021-09-10: qty 2

## 2021-09-10 MED ORDER — DEXAMETHASONE SODIUM PHOSPHATE 10 MG/ML IJ SOLN
INTRAMUSCULAR | Status: AC
  Filled 2021-09-10: qty 1

## 2021-09-10 MED ORDER — ACETAMINOPHEN 10 MG/ML IV SOLN
INTRAVENOUS | Status: AC
  Administered 2021-09-10: 1000 mg via INTRAVENOUS
  Filled 2021-09-10: qty 100

## 2021-09-10 MED ORDER — ENOXAPARIN SODIUM 30 MG/0.3ML IJ SOSY
30.0000 mg | PREFILLED_SYRINGE | Freq: Two times a day (BID) | INTRAMUSCULAR | Status: DC
  Administered 2021-09-11: 30 mg via SUBCUTANEOUS
  Filled 2021-09-10 (×3): qty 0.3

## 2021-09-10 MED ORDER — DEXAMETHASONE SODIUM PHOSPHATE 10 MG/ML IJ SOLN
INTRAMUSCULAR | Status: AC
  Administered 2021-09-10: 8 mg via INTRAVENOUS
  Filled 2021-09-10: qty 1

## 2021-09-10 MED ORDER — BUPIVACAINE HCL (PF) 0.5 % IJ SOLN
INTRAMUSCULAR | Status: DC | PRN
  Administered 2021-09-10: 3 mL

## 2021-09-10 MED ORDER — FERROUS SULFATE 325 (65 FE) MG PO TABS
325.0000 mg | ORAL_TABLET | Freq: Two times a day (BID) | ORAL | Status: DC
  Administered 2021-09-10 – 2021-09-11 (×2): 325 mg via ORAL
  Filled 2021-09-10 (×4): qty 1

## 2021-09-10 MED ORDER — 0.9 % SODIUM CHLORIDE (POUR BTL) OPTIME
TOPICAL | Status: DC | PRN
  Administered 2021-09-10: 500 mL

## 2021-09-10 MED ORDER — CELECOXIB 200 MG PO CAPS
ORAL_CAPSULE | ORAL | Status: AC
  Filled 2021-09-10: qty 1

## 2021-09-10 MED ORDER — LIDOCAINE HCL (PF) 2 % IJ SOLN
INTRAMUSCULAR | Status: AC
  Filled 2021-09-10: qty 5

## 2021-09-10 MED ORDER — OXYCODONE HCL 5 MG PO TABS: 5.0000 mg | ORAL_TABLET | ORAL | Status: DC | PRN

## 2021-09-10 MED ORDER — ONDANSETRON HCL 4 MG PO TABS: 4.0000 mg | ORAL_TABLET | Freq: Four times a day (QID) | ORAL | Status: DC | PRN

## 2021-09-10 MED ORDER — CELECOXIB 200 MG PO CAPS: 400.0000 mg | ORAL_CAPSULE | Freq: Once | ORAL | Status: AC

## 2021-09-10 MED ORDER — NEOMYCIN-POLYMYXIN B GU 40-200000 IR SOLN
Status: DC | PRN
  Administered 2021-09-10: 2 mL

## 2021-09-10 MED ORDER — DIPHENHYDRAMINE HCL 12.5 MG/5ML PO ELIX
12.5000 mg | ORAL_SOLUTION | ORAL | Status: DC | PRN
  Filled 2021-09-10: qty 10

## 2021-09-10 MED ORDER — SENNOSIDES-DOCUSATE SODIUM 8.6-50 MG PO TABS
1.0000 | ORAL_TABLET | Freq: Two times a day (BID) | ORAL | Status: DC
  Administered 2021-09-10 – 2021-09-11 (×2): 1 via ORAL
  Filled 2021-09-10 (×3): qty 1

## 2021-09-10 MED ORDER — METOCLOPRAMIDE HCL 10 MG PO TABS
ORAL_TABLET | ORAL | Status: AC
  Administered 2021-09-10: 10 mg via ORAL
  Filled 2021-09-10: qty 1

## 2021-09-10 MED ORDER — ONDANSETRON HCL 4 MG/2ML IJ SOLN: 4.0000 mg | Freq: Four times a day (QID) | INTRAMUSCULAR | Status: DC | PRN

## 2021-09-10 MED ORDER — TRAMADOL HCL 50 MG PO TABS: 50.0000 mg | ORAL_TABLET | ORAL | Status: DC | PRN

## 2021-09-10 MED ORDER — FENTANYL CITRATE (PF) 100 MCG/2ML IJ SOLN
INTRAMUSCULAR | Status: DC | PRN
  Administered 2021-09-10 (×4): 25 ug via INTRAVENOUS
  Administered 2021-09-10 (×2): 50 ug via INTRAVENOUS

## 2021-09-10 MED ORDER — SUCCINYLCHOLINE CHLORIDE 200 MG/10ML IV SOSY
PREFILLED_SYRINGE | INTRAVENOUS | Status: DC | PRN
  Administered 2021-09-10: 120 mg via INTRAVENOUS

## 2021-09-10 MED ORDER — CEFAZOLIN SODIUM-DEXTROSE 2-4 GM/100ML-% IV SOLN
2.0000 g | INTRAVENOUS | Status: AC
  Administered 2021-09-10: 2 g via INTRAVENOUS

## 2021-09-10 MED ORDER — ACETAMINOPHEN 10 MG/ML IV SOLN: 1000.0000 mg | Freq: Four times a day (QID) | INTRAVENOUS | Status: DC

## 2021-09-10 MED ORDER — PROPOFOL 1000 MG/100ML IV EMUL
INTRAVENOUS | Status: AC
  Filled 2021-09-10: qty 100

## 2021-09-10 SURGICAL SUPPLY — 63 items
AML 16.5 LRG 12/14 OFFSET (Hips) ×2 IMPLANT
BLADE DRUM FLTD (BLADE) ×2 IMPLANT
BLADE SAW 90X25X1.19 OSCILLAT (BLADE) ×2 IMPLANT
CARTRIDGE OIL MAESTRO DRILL (MISCELLANEOUS) ×1 IMPLANT
CUP PINNACLE 100 SERIES 58MM (Hips) ×1 IMPLANT
DIFFUSER DRILL AIR PNEUMATIC (MISCELLANEOUS) ×2 IMPLANT
DRAPE 3/4 80X56 (DRAPES) ×2 IMPLANT
DRAPE INCISE IOBAN 66X60 STRL (DRAPES) ×2 IMPLANT
DRSG DERMACEA 8X12 NADH (GAUZE/BANDAGES/DRESSINGS) ×2 IMPLANT
DRSG MEPILEX SACRM 8.7X9.8 (GAUZE/BANDAGES/DRESSINGS) ×2 IMPLANT
DRSG OPSITE POSTOP 4X12 (GAUZE/BANDAGES/DRESSINGS) ×2 IMPLANT
DRSG OPSITE POSTOP 4X14 (GAUZE/BANDAGES/DRESSINGS) IMPLANT
DRSG TEGADERM 4X4.75 (GAUZE/BANDAGES/DRESSINGS) ×2 IMPLANT
DURAPREP 26ML APPLICATOR (WOUND CARE) ×3 IMPLANT
ELECT CAUTERY BLADE 6.4 (BLADE) ×2 IMPLANT
ELECT REM PT RETURN 9FT ADLT (ELECTROSURGICAL) ×2
ELECTRODE REM PT RTRN 9FT ADLT (ELECTROSURGICAL) ×1 IMPLANT
GAUZE 4X4 16PLY ~~LOC~~+RFID DBL (SPONGE) ×2 IMPLANT
GLOVE SURG ENC MOIS LTX SZ7.5 (GLOVE) ×4 IMPLANT
GLOVE SURG ENC TEXT LTX SZ7.5 (GLOVE) ×4 IMPLANT
GLOVE SURG UNDER LTX SZ8 (GLOVE) ×2 IMPLANT
GLOVE SURG UNDER POLY LF SZ7.5 (GLOVE) ×2 IMPLANT
GOWN STRL REUS W/ TWL LRG LVL3 (GOWN DISPOSABLE) ×2 IMPLANT
GOWN STRL REUS W/ TWL XL LVL3 (GOWN DISPOSABLE) ×1 IMPLANT
GOWN STRL REUS W/TWL LRG LVL3 (GOWN DISPOSABLE) ×2
GOWN STRL REUS W/TWL XL LVL3 (GOWN DISPOSABLE) ×1
HEAD M SROM 36MM PLUS 1.5 (Hips) IMPLANT
HEMOVAC 400CC 10FR (MISCELLANEOUS) ×2 IMPLANT
HIP AML 16.5 LRG 12/14 OFFSET (Hips) IMPLANT
HOLDER FOLEY CATH W/STRAP (MISCELLANEOUS) ×2 IMPLANT
IRRIGATION SURGIPHOR STRL (IV SOLUTION) IMPLANT
IV NS IRRIG 3000ML ARTHROMATIC (IV SOLUTION) ×2 IMPLANT
KIT PEG BOARD PINK (KITS) ×2 IMPLANT
KIT TURNOVER KIT A (KITS) ×2 IMPLANT
LINER MARATHON 10D 36M (Hips) IMPLANT
LINER MARATHON 10DEG 36M (Hips) ×1 IMPLANT
MANIFOLD NEPTUNE II (INSTRUMENTS) ×4 IMPLANT
NDL SAFETY ECLIPSE 18X1.5 (NEEDLE) ×1 IMPLANT
NEEDLE HYPO 18GX1.5 SHARP (NEEDLE) ×1
NS IRRIG 500ML POUR BTL (IV SOLUTION) ×2 IMPLANT
OIL CARTRIDGE MAESTRO DRILL (MISCELLANEOUS) ×2
PACK HIP PROSTHESIS (MISCELLANEOUS) ×2 IMPLANT
PENCIL SMOKE EVACUATOR COATED (MISCELLANEOUS) ×2 IMPLANT
PULSAVAC PLUS IRRIG FAN TIP (DISPOSABLE) ×2
SOL PREP PVP 2OZ (MISCELLANEOUS) ×2
SOLUTION PREP PVP 2OZ (MISCELLANEOUS) ×1 IMPLANT
SPONGE DRAIN TRACH 4X4 STRL 2S (GAUZE/BANDAGES/DRESSINGS) ×2 IMPLANT
SPONGE T-LAP 18X18 ~~LOC~~+RFID (SPONGE) ×8 IMPLANT
SROM M HEAD 36MM PLUS 1.5 (Hips) ×2 IMPLANT
STAPLER SKIN PROX 35W (STAPLE) ×2 IMPLANT
SUT ETHIBOND #5 BRAIDED 30INL (SUTURE) ×2 IMPLANT
SUT VIC AB 0 CT1 36 (SUTURE) ×2 IMPLANT
SUT VIC AB 1 CT1 36 (SUTURE) ×4 IMPLANT
SUT VIC AB 2-0 CT1 27 (SUTURE) ×1
SUT VIC AB 2-0 CT1 TAPERPNT 27 (SUTURE) ×1 IMPLANT
SYR 20ML LL LF (SYRINGE) ×2 IMPLANT
TAPE CLOTH 3X10 WHT NS LF (GAUZE/BANDAGES/DRESSINGS) ×2 IMPLANT
TAPE TRANSPORE STRL 2 31045 (GAUZE/BANDAGES/DRESSINGS) ×2 IMPLANT
TIP FAN IRRIG PULSAVAC PLUS (DISPOSABLE) ×1 IMPLANT
TOWEL OR 17X26 4PK STRL BLUE (TOWEL DISPOSABLE) ×1 IMPLANT
TRAY FOLEY MTR SLVR 16FR STAT (SET/KITS/TRAYS/PACK) ×2 IMPLANT
WATER STERILE IRR 1000ML POUR (IV SOLUTION) ×1 IMPLANT
WATER STERILE IRR 500ML POUR (IV SOLUTION) ×1 IMPLANT

## 2021-09-10 NOTE — H&P (Signed)
The patient has been re-examined, and the chart reviewed, and there have been no interval changes to the documented history and physical.    The risks, benefits, and alternatives have been discussed at length. The patient expressed understanding of the risks benefits and agreed with plans for surgical intervention.  Abrie Egloff P. Branna Cortina, Jr. M.D.    

## 2021-09-10 NOTE — Anesthesia Procedure Notes (Signed)
Procedure Name: Intubation Date/Time: 09/10/2021 8:57 AM Performed by: Debe Coder, CRNA Pre-anesthesia Checklist: Patient identified, Emergency Drugs available, Suction available and Patient being monitored Patient Re-evaluated:Patient Re-evaluated prior to induction Oxygen Delivery Method: Circle system utilized Preoxygenation: Pre-oxygenation with 100% oxygen Induction Type: IV induction Ventilation: Mask ventilation without difficulty Laryngoscope Size: McGraph and 4 Grade View: Grade I Tube type: Oral Tube size: 7.0 mm Number of attempts: 1 Airway Equipment and Method: Stylet and Oral airway Placement Confirmation: ETT inserted through vocal cords under direct vision, positive ETCO2 and breath sounds checked- equal and bilateral Tube secured with: Tape Dental Injury: Teeth and Oropharynx as per pre-operative assessment

## 2021-09-10 NOTE — Plan of Care (Signed)
Continue with current plan of care.

## 2021-09-10 NOTE — Evaluation (Signed)
Physical Therapy Evaluation Patient Details Name: Mitchell Mcdowell MRN: [id number] DOB: 08-06-1951 Today's Date: 09/10/2021  History of Present Illness  admitted for acute hospitalization s/p L THA, posterior approach, WBAT (09/10/21).  Clinical Impression  Patient resting in bed upon arrival to room; alert and oriented, follows commands and eager for mobility progression.  Pain well-controlled (FACES 2/10); meds received prior to session.  L LE with good post-op strength (at least 3-/5) and ROM (within THPs) as noted with isolated therex and functional activities.  Able to complete bed mobility with min assist; sit/stand, basic transfers and gait (40' x2) with RW, cga/close sup.    Demonstrates step to progressing to partially reciprocal stepping pattern; mod WBing bilat UEs; fair/good WBing and stance time L LE.  Excellent effort with all mobility tasks; immediate integration of cuing/education.  Anticipate consistent progress towards all therapy goals. Would benefit from skilled PT to address above deficits and promote optimal return to PLOF.; recommend discharge to home with follow up as recommended by surgeon.     Recommendations for follow up therapy are one component of a multi-disciplinary discharge planning process, led by the attending physician.  Recommendations may be updated based on patient status, additional functional criteria and insurance authorization.  Follow Up Recommendations Follow physician's recommendations for discharge plan and follow up therapies    Assistance Recommended at Discharge PRN  Functional Status Assessment Patient has had a recent decline in their functional status and demonstrates the ability to make significant improvements in function in a reasonable and predictable amount of time.  Equipment Recommendations  Rolling walker (2 wheels);BSC    Recommendations for Other Services       Precautions / Restrictions Precautions Precautions: Fall;Posterior  Hip Restrictions Weight Bearing Restrictions: Yes LLE Weight Bearing: Weight bearing as tolerated      Mobility  Bed Mobility Overal bed mobility: Needs Assistance Bed Mobility: Supine to Sit     Supine to sit: Min assist     General bed mobility comments: for management/position of L LE    Transfers Overall transfer level: Needs assistance Equipment used: Rolling walker (2 wheels) Transfers: Sit to/from Stand Sit to Stand: Min assist           General transfer comment: cuing for hand placement; education/assist to prevent L LE IR with movement transition    Ambulation/Gait Ambulation/Gait assistance: Min assist Gait Distance (Feet):  (40' x2) Assistive device: Rolling walker (2 wheels)       General Gait Details: step to progressing to partially reciprocal stepping pattern; mod WBing bilat UEs; fair/good WBing and stance time L LE  Stairs            Wheelchair Mobility    Modified Rankin (Stroke Patients Only)       Balance Overall balance assessment: Needs assistance Sitting-balance support: No upper extremity supported;Feet supported Sitting balance-Leahy Scale: Good     Standing balance support: Bilateral upper extremity supported Standing balance-Leahy Scale: Fair                               Pertinent Vitals/Pain Pain Assessment: Faces Faces Pain Scale: Hurts a little bit Pain Location: L hip Pain Descriptors / Indicators: Aching Pain Intervention(s): Limited activity within patient's tolerance;Monitored during session;Repositioned;Premedicated before session    Home Living Family/patient expects to be discharged to:: Private residence Living Arrangements: Spouse/significant other Available Help at Discharge: Family Type of Home: House Home Access: Stairs to  enter Entrance Stairs-Rails: None Entrance Stairs-Number of Steps: 3   Home Layout: One level Home Equipment: Conservation officer, nature (2 wheels);BSC      Prior  Function Prior Level of Function : Independent/Modified Independent;Driving             Mobility Comments: Enjoys Publishing rights manager Dominance        Extremity/Trunk Assessment   Upper Extremity Assessment Upper Extremity Assessment: Overall WFL for tasks assessed    Lower Extremity Assessment Lower Extremity Assessment: Overall WFL for tasks assessed (L LE grossly 3-/5 throughout, limited by post-op pain/weakness)       Communication   Communication: No difficulties  Cognition Arousal/Alertness: Awake/alert Behavior During Therapy: WFL for tasks assessed/performed Overall Cognitive Status: Within Functional Limits for tasks assessed                                          General Comments      Exercises Other Exercises Other Exercises: Reviewed role of PT; educated in [place] and posterior THPs; reviewed activity progression and discharge goals.  Patient/wife voiced understanding. Other Exercises: Toilet transfer, ambulatory with RW, cga/close sup.  Standing balance with RW for continent bladder episode, close sup.   Assessment/Plan    PT Assessment Patient needs continued PT services  PT Problem List Decreased strength;Decreased range of motion;Decreased activity tolerance;Decreased balance;Decreased mobility;Decreased knowledge of use of DME;Decreased safety awareness;Decreased knowledge of precautions;Cardiopulmonary status limiting activity;Pain       PT Treatment Interventions DME instruction;Gait training;Stair training;Functional mobility training;Therapeutic activities;Therapeutic exercise;Patient/family education    PT Goals (Current goals can be found in the Care Plan section)  Acute Rehab PT Goals Patient Stated Goal: to be better by the spring for golf-season PT Goal Formulation: With patient Time For Goal Achievement: 09/24/21 Potential to Achieve Goals: Good    Frequency BID   Barriers to discharge        Co-evaluation                AM-PAC PT "6 Clicks" Mobility  Outcome Measure Help needed turning from your back to your side while in a flat bed without using bedrails?: None Help needed moving from lying on your back to sitting on the side of a flat bed without using bedrails?: A Little Help needed moving to and from a bed to a chair (including a wheelchair)?: A Little Help needed standing up from a chair using your arms (e.g., wheelchair or bedside chair)?: A Little Help needed to walk in hospital room?: A Little Help needed climbing 3-5 steps with a railing? : A Little 6 Click Score: 19    End of Session Equipment Utilized During Treatment: Gait belt Activity Tolerance: Patient tolerated treatment well Patient left: in chair;with call bell/phone within reach;with family/visitor present Nurse Communication: Mobility status PT Visit Diagnosis: Muscle weakness (generalized) (M62.81);Difficulty in walking, not elsewhere classified (R26.2);Pain Pain - Right/Left: Left Pain - part of body: Hip    Time: 1324-4010 PT Time Calculation (min) (ACUTE ONLY): 28 min   Charges:   PT Evaluation $PT Eval Moderate Complexity: 1 Mod PT Treatments $Therapeutic Activity: 8-22 mins       Ayva Veilleux H. Owens Shark, PT, DPT, NCS 09/10/21, 5:35 PM [phone]

## 2021-09-10 NOTE — Anesthesia Preprocedure Evaluation (Signed)
Anesthesia Evaluation  Patient identified by MRN, date of birth, ID band Patient awake    Reviewed: Allergy & Precautions, H&P , NPO status , Patient's Chart, lab work & pertinent test results, reviewed documented beta blocker date and time   Airway Mallampati: III   Neck ROM: full    Dental  (+) Poor Dentition, Teeth Intact   Pulmonary neg pulmonary ROS,    Pulmonary exam normal        Cardiovascular Exercise Tolerance: Good hypertension, On Medications negative cardio ROS Normal cardiovascular exam Rhythm:regular Rate:Normal     Neuro/Psych negative neurological ROS  negative psych ROS   GI/Hepatic negative GI ROS, Neg liver ROS,   Endo/Other  Hypothyroidism   Renal/GU negative Renal ROS  negative genitourinary   Musculoskeletal   Abdominal   Peds  Hematology negative hematology ROS (+)   Anesthesia Other Findings Past Medical History: No date: Arthritis No date: Hypertension No date: Hypothyroidism No date: Impotence No date: Vitiligo Past Surgical History: 35 years ago: ARTHROSCOPIC REPAIR ACL; Right 10/02/2019: CATARACT EXTRACTION W/PHACO; Right     Comment:  Procedure: CATARACT EXTRACTION PHACO AND INTRAOCULAR               LENS PLACEMENT (IOC) RIGHT 2.05,   00:26.0;  Surgeon:               Eulogio Bear, MD;  Location: [facility];                Service: Ophthalmology;  Laterality: Right; 01/20/2016: COLONOSCOPY WITH PROPOFOL; N/A     Comment:  Procedure: COLONOSCOPY WITH PROPOFOL;  Surgeon: Hulen Luster, MD;  Location: [facility] ENDOSCOPY;  Service:               Gastroenterology;  Laterality: N/A; 01/20/2016: ESOPHAGOGASTRODUODENOSCOPY (EGD) WITH PROPOFOL; N/A     Comment:  Procedure: ESOPHAGOGASTRODUODENOSCOPY (EGD) WITH               PROPOFOL;  Surgeon: Hulen Luster, MD;  Location: [facility]               ENDOSCOPY;  Service: Gastroenterology;  Laterality: N/A; 2000: THYROID CYST  EXCISION BMI    Body Mass Index: 25.99 kg/m     Reproductive/Obstetrics negative OB ROS                             Anesthesia Physical Anesthesia Plan  ASA: 3  Anesthesia Plan: Spinal   Post-op Pain Management:    Induction:   PONV Risk Score and Plan: 2  Airway Management Planned:   Additional Equipment:   Intra-op Plan:   Post-operative Plan:   Informed Consent: I have reviewed the patients History and Physical, chart, labs and discussed the procedure including the risks, benefits and alternatives for the proposed anesthesia with the patient or authorized representative who has indicated his/her understanding and acceptance.     Dental Advisory Given  Plan Discussed with: CRNA  Anesthesia Plan Comments:         Anesthesia Quick Evaluation

## 2021-09-10 NOTE — Transfer of Care (Signed)
Immediate Anesthesia Transfer of Care Note  Patient: Mitchell Mcdowell  Procedure(s) Performed: TOTAL HIP ARTHROPLASTY (Left: Hip)  Patient Location: PACU  Anesthesia Type:General and GA combined with regional for post-op pain  Level of Consciousness: awake and drowsy  Airway & Oxygen Therapy: Patient Spontanous Breathing  Post-op Assessment: Report given to RN and Post -op Vital signs reviewed and stable  Post vital signs: Reviewed and stable  Last Vitals:  Vitals Value Taken Time  BP 128/72 09/10/21 1230  Temp 36.4 C 09/10/21 1230  Pulse 84 09/10/21 1242  Resp 16 09/10/21 1242  SpO2 95 % 09/10/21 1242    Last Pain:  Vitals:   09/10/21 1230  TempSrc:   PainSc: Asleep         Complications: No notable events documented.

## 2021-09-10 NOTE — Op Note (Signed)
OPERATIVE NOTE  DATE OF SURGERY:  09/10/2021  PATIENT NAME:  Mitchell Mcdowell   DOB: 1951/10/13  MRN: [id number]  PRE-OPERATIVE DIAGNOSIS: Degenerative arthrosis of the left hip, primary  POST-OPERATIVE DIAGNOSIS:  Same  PROCEDURE:  Left total hip arthroplasty  SURGEON:  Marciano Sequin. M.D.  ASSISTANT: Cassell Smiles, PA-C (present and scrubbed throughout the case, critical for assistance with exposure, retraction, instrumentation, and closure)  ANESTHESIA: spinal and general  ESTIMATED BLOOD LOSS: 100 mL  FLUIDS REPLACED: 1500 mL of crystalloid  DRAINS: 2 medium Hemovac drains  IMPLANTS UTILIZED: DePuy 16.5 mm large stature AML femoral stem, 58 mm OD Pinnacle 100 acetabular component, +4 mm 10 degree Pinnacle Marathon polyethylene insert, and a 36 mm M-SPEC +1.5 mm hip ball  INDICATIONS FOR SURGERY: Mitchell Mcdowell is a 70 y.o. year old male with a long history of progressive hip and groin  pain. X-rays demonstrated severe degenerative changes. The patient had not seen any significant improvement despite conservative nonsurgical intervention. After discussion of the risks and benefits of surgical intervention, the patient expressed understanding of the risks benefits and agree with plans for total hip arthroplasty.   The risks, benefits, and alternatives were discussed at length including but not limited to the risks of infection, bleeding, nerve injury, stiffness, blood clots, the need for revision surgery, limb length inequality, dislocation, cardiopulmonary complications, among others, and they were willing to proceed.  PROCEDURE IN DETAIL: The patient was brought into the operating room and, after adequate spinal and general anesthesia was achieved, the patient was placed in a right lateral decubitus position. Axillary roll was placed and all bony prominences were well-padded. The patient's left hip was cleaned and prepped with alcohol and DuraPrep and draped in the usual sterile  fashion. A "timeout" was performed as per usual protocol. A lateral curvilinear incision was made gently curving towards the posterior superior iliac spine. The IT band was incised in line with the skin incision and the fibers of the gluteus maximus were split in line. The piriformis tendon was identified, skeletonized, and incised at its insertion to the proximal femur and reflected posteriorly. A T type posterior capsulotomy was performed. Prior to dislocation of the femoral head, a threaded Steinmann pin was inserted through a separate stab incision into the pelvis superior to the acetabulum and bent in the form of a stylus so as to assess limb length and hip offset throughout the procedure. The femoral head was then dislocated posteriorly. Inspection of the femoral head demonstrated severe degenerative changes with full-thickness loss of articular cartilage. The femoral neck cut was performed using an oscillating saw. The anterior capsule was elevated off of the femoral neck using a periosteal elevator. Attention was then directed to the acetabulum. The remnant of the labrum was excised using electrocautery. Inspection of the acetabulum also demonstrated significant degenerative changes. The acetabulum was reamed in sequential fashion up to a 57 mm diameter. Good punctate bleeding bone was encountered. A 58 mm Pinnacle 100 acetabular component was positioned and impacted into place. Good scratch fit was appreciated. A neutral polyethylene trial was inserted.  Attention was then directed to the proximal femur. A hole for reaming of the proximal femoral canal was created using a high-speed burr. The femoral canal was reamed in sequential fashion up to a 16 mm diameter. This allowed for approximately 6 cm of scratch fit.  It was thus elected to ream up to a 16.5 mm diameter to allow for a line to line  fit.  Serial broaches were inserted up to a 16.5 mm large stature femoral broach. Calcar region was planed and a  trial reduction was performed using a 36 mm hip ball with a +1.5 mm neck length.  Reasonably good stability was noted but it was elected to trial with a +4 mm 10 degree offset with the high side at the 4 o'clock position.  Good equalization of limb lengths and hip offset was appreciated and excellent stability was noted both anteriorly and posteriorly. Trial components were removed. The acetabular shell was irrigated with copious amounts of normal saline with antibiotic solution and suctioned dry. A +4 mm 10 degree Pinnacle Marathon polyethylene insert was positioned with the high side at the 4 o'clock position and impacted into place. Next, a 16.5 mm large stature AML femoral stem was positioned and impacted into place. Excellent scratch fit was appreciated. A trial reduction was again performed with a 36 mm hip ball with a +1.5 mm neck length. Again, good equalization of limb lengths was appreciated and excellent stability appreciated both anteriorly and posteriorly. The hip was then dislocated and the trial hip ball was removed. The Morse taper was cleaned and dried. A 36 mm M-SPEC hip ball with a +1.5 mm neck length was placed on the trunnion and impacted into place. The hip was then reduced and placed through range of motion. Excellent stability was appreciated both anteriorly and posteriorly.  The wound was irrigated with copious amounts of normal saline followed by 350 ml of Prontosan and suctioned dry. Good hemostasis was appreciated. The posterior capsulotomy was repaired using #5 Ethibond. Piriformis tendon was reapproximated to the undersurface of the gluteus medius tendon using #5 Ethibond. The IT band was reapproximated using interrupted sutures of #1 Vicryl. Subcutaneous tissue was approximated using first #0 Vicryl followed by #2-0 Vicryl. The skin was closed with skin staples.  The patient tolerated the procedure well and was transported to the recovery room in stable condition.   Marciano Sequin., M.D.

## 2021-09-11 ENCOUNTER — Encounter: Payer: Self-pay | Admitting: Orthopedic Surgery

## 2021-09-11 DIAGNOSIS — M1612 Unilateral primary osteoarthritis, left hip: Secondary | ICD-10-CM | POA: Diagnosis not present

## 2021-09-11 MED ORDER — ACETAMINOPHEN 10 MG/ML IV SOLN: 1000.0000 mg | Freq: Four times a day (QID) | INTRAVENOUS | Status: DC

## 2021-09-11 MED ORDER — MAGNESIUM HYDROXIDE 400 MG/5ML PO SUSP
ORAL | Status: AC
  Administered 2021-09-11: 30 mL via ORAL
  Filled 2021-09-11: qty 30

## 2021-09-11 MED ORDER — OXYCODONE HCL 5 MG PO TABS
ORAL_TABLET | ORAL | Status: AC
  Filled 2021-09-11: qty 2

## 2021-09-11 MED ORDER — METOCLOPRAMIDE HCL 10 MG PO TABS
ORAL_TABLET | ORAL | Status: AC
  Administered 2021-09-11: 10 mg via ORAL
  Filled 2021-09-11: qty 1

## 2021-09-11 MED ORDER — ENOXAPARIN SODIUM 40 MG/0.4ML IJ SOSY: 40.0000 mg | PREFILLED_SYRINGE | INTRAMUSCULAR | 0 refills | Status: AC

## 2021-09-11 MED ORDER — TRAMADOL HCL 50 MG PO TABS: 50.0000 mg | ORAL_TABLET | ORAL | 0 refills | Status: AC | PRN

## 2021-09-11 MED ORDER — CELECOXIB 200 MG PO CAPS
ORAL_CAPSULE | ORAL | Status: AC
  Filled 2021-09-11: qty 1

## 2021-09-11 MED ORDER — CELECOXIB 200 MG PO CAPS: 200.0000 mg | ORAL_CAPSULE | Freq: Two times a day (BID) | ORAL | 0 refills | Status: AC

## 2021-09-11 MED ORDER — ACETAMINOPHEN 10 MG/ML IV SOLN
INTRAVENOUS | Status: AC
  Administered 2021-09-11: 1000 mg via INTRAVENOUS
  Filled 2021-09-11: qty 100

## 2021-09-11 MED ORDER — OXYCODONE HCL 5 MG PO TABS: 5.0000 mg | ORAL_TABLET | ORAL | 0 refills | Status: AC | PRN

## 2021-09-11 MED ORDER — METOCLOPRAMIDE HCL 10 MG PO TABS
ORAL_TABLET | ORAL | Status: AC
  Filled 2021-09-11: qty 1

## 2021-09-11 NOTE — Progress Notes (Signed)
IV tylenol given off schedule due to the dose given at 1726 being clamped and never went in.

## 2021-09-11 NOTE — Discharge Summary (Signed)
Physician Discharge Summary  Patient ID: Mitchell Mcdowell MRN: [id number] DOB/AGE: [birth year] 70 y.o.  Admit date: 09/10/2021 Discharge date: 09/11/2021  Admission Diagnoses:  Hx of total hip arthroplasty, left [Z96.642]  Surgeries:Procedure(s):  Left total hip arthroplasty   SURGEON:  Marciano Sequin. M.D.   ASSISTANT: Cassell Smiles, PA-C (present and scrubbed throughout the case, critical for assistance with exposure, retraction, instrumentation, and closure)   ANESTHESIA: spinal and general   ESTIMATED BLOOD LOSS: 100 mL   FLUIDS REPLACED: 1500 mL of crystalloid   DRAINS: 2 medium Hemovac drains   IMPLANTS UTILIZED: DePuy 16.5 mm large stature AML femoral stem, 58 mm OD Pinnacle 100 acetabular component, +4 mm 10 degree Pinnacle Marathon polyethylene insert, and a 36 mm M-SPEC +1.5 mm hip ball    Discharge Diagnoses: Patient Active Problem List   Diagnosis Date Noted   Hx of total hip arthroplasty, left 09/10/2021   Thrombocytopenia ([place]) 07/31/2021   Benign essential HTN 08/13/2014   Hypothyroidism (acquired) 08/13/2014   Colon polyp 03/20/2014   Impotence 03/20/2014   Inflammatory arthritis 03/20/2014   Osteoarthritis 03/20/2014   Vitiligo 03/20/2014    Past Medical History:  Diagnosis Date   Arthritis    Hypertension    Hypothyroidism    Impotence    Vitiligo      Transfusion:    Consultants (if any):   Discharged Condition: Improved  Hospital Course: Mitchell Mcdowell is an 70 y.o. male who was admitted 09/10/2021 with a diagnosis of left hip osteoarthritis and went to the operating room on 09/10/2021 and underwent left total hip arthroplasty through posterior approach. The patient received perioperative antibiotics for prophylaxis (see below). The patient tolerated the procedure well and was transported to PACU in stable condition. After meeting PACU criteria, the patient was subsequently transferred to the Orthopaedics/Rehabilitation unit.   The patient  received DVT prophylaxis in the form of early mobilization, Lovenox, Foot Pumps, and TED hose. A sacral pad had been placed and heels were elevated off of the bed with rolled towels in order to protect skin integrity. Foley catheter was discontinued on postoperative day #0. Wound drains were discontinued on postoperative day #1. The surgical incision was healing well without signs of infection.  Physical therapy was initiated postoperatively for transfers, gait training, and strengthening. Occupational therapy was initiated for activities of daily living and evaluation for assisted devices. Rehabilitation goals were reviewed in detail with the patient. The patient made steady progress with physical therapy and physical therapy recommended discharge to Home.   The patient achieved the preliminary goals of this hospitalization and was felt to be medically and orthopaedically appropriate for discharge.  He was given perioperative antibiotics:  Anti-infectives (From admission, onward)    Start     Dose/Rate Route Frequency Ordered Stop   09/10/21 2042  ceFAZolin (ANCEF) 2-4 GM/100ML-% IVPB       Note to Pharmacy: Lyman Bishop   : cabinet override      09/10/21 2042 09/11/21 0500   09/10/21 1500  ceFAZolin (ANCEF) IVPB 2g/100 mL premix        2 g 200 mL/hr over 30 Minutes Intravenous Every 6 hours 09/10/21 1237 09/11/21 0004   09/10/21 0721  ceFAZolin (ANCEF) 2-4 GM/100ML-% IVPB       Note to Pharmacy: Sylvester Harder   : cabinet override      09/10/21 0721 09/11/21 0004   09/10/21 0600  ceFAZolin (ANCEF) IVPB 2g/100 mL premix  2 g 200 mL/hr over 30 Minutes Intravenous On call to O.R. 09/10/21 8786 09/10/21 0851     .  Recent vital signs:  Vitals:   09/11/21 0731 09/11/21 1100  BP: (!) 149/71 (!) 144/68  Pulse: 77 67  Resp: 16 15  Temp: 99 F (37.2 C) 98.7 F (37.1 C)  SpO2: 99% 100%    Recent laboratory studies:  No results for input(s): WBC, HGB, HCT, PLT, K, CL, CO2, BUN,  CREATININE, GLUCOSE, CALCIUM, LABPT, INR in the last 72 hours.  Diagnostic Studies: DG Hip Port Unilat With Pelvis 1V Left  Result Date: 09/10/2021 CLINICAL DATA:  Post LEFT total hip arthroplasty EXAM: DG HIP (WITH OR WITHOUT PELVIS) 1V PORT LEFT COMPARISON:  Portable exam 1250 hours without priors for comparison FINDINGS: Osseous demineralization. LEFT hip prosthesis with components in expected position. Overlying surgical drain and skin clips. No acute fracture, dislocation, or bone destruction. Narrowing of RIGHT hip joint noted. IMPRESSION: LEFT hip prosthesis without acute complication. Degenerative changes RIGHT hip joint. Electronically Signed   By: Lavonia Dana M.D.   On: 09/10/2021 13:01    Discharge Medications:   Allergies as of 09/11/2021   No Known Allergies      Medication List     STOP taking these medications    Adalimumab 40 MG/0.4ML Pnkt   ibuprofen 200 MG tablet Commonly known as: ADVIL       TAKE these medications    celecoxib 200 MG capsule Commonly known as: CELEBREX Take 1 capsule (200 mg total) by mouth 2 (two) times daily.   enoxaparin 40 MG/0.4ML injection Commonly known as: LOVENOX Inject 0.4 mLs (40 mg total) into the skin daily for 14 days.   ferrous sulfate 325 (65 FE) MG tablet Take 325 mg by mouth 2 (two) times daily with a meal.   fluorouracil 5 % cream Commonly known as: EFUDEX Apply topically 2 (two) times daily. Bid to tops of hands for 7 days   fluticasone 50 MCG/ACT nasal spray Commonly known as: FLONASE Place 2 sprays into both nostrils daily.   folic acid 1 MG tablet Commonly known as: FOLVITE Take 1 mg by mouth daily.   hydrochlorothiazide 12.5 MG capsule Commonly known as: MICROZIDE Take 12.5 mg by mouth daily.   levothyroxine 175 MCG tablet Commonly known as: SYNTHROID Take 175 mcg by mouth daily before breakfast.   loratadine 10 MG tablet Commonly known as: CLARITIN Take 10 mg by mouth daily.   methotrexate 2.5  MG tablet Commonly known as: RHEUMATREX Take 10 mg by mouth every Wednesday. Caution:Chemotherapy. Protect from light.   oxyCODONE 5 MG immediate release tablet Commonly known as: Oxy IR/ROXICODONE Take 1 tablet (5 mg total) by mouth every 4 (four) hours as needed for severe pain (pain score 4-6).   RA RELIEF STERILE EYE DROPS OP Apply to eye.   sildenafil 50 MG tablet Commonly known as: VIAGRA Take 50 mg by mouth daily as needed for erectile dysfunction.   tetrahydrozoline 0.05 % ophthalmic solution Place 1 drop into both eyes daily as needed (dry/irritated eyes).   traMADol 50 MG tablet Commonly known as: ULTRAM Take 1 tablet (50 mg total) by mouth every 4 (four) hours as needed for moderate pain.               Durable Medical Equipment  (From admission, onward)           Start     Ordered   09/10/21 1357  DME Walker rolling  Once       Question:  Patient needs a walker to treat with the following condition  Answer:  S/P total hip arthroplasty   09/10/21 1356   09/10/21 1357  DME Bedside commode  Once       Question:  Patient needs a bedside commode to treat with the following condition  Answer:  S/P total hip arthroplasty   09/10/21 1356            Disposition: Home with home health PT     Follow-up Information     Hooten, Laurice Record, MD Follow up on 10/17/2021.   Specialty: Orthopedic Surgery Why: at 11:30am Contact information: [place] [address] [place], PA-C 09/11/2021, 1:04 PM

## 2021-09-11 NOTE — Evaluation (Signed)
Occupational Therapy Evaluation Patient Details Name: Mitchell Mcdowell MRN: [id number] DOB: 1951-10-23 Today's Date: 09/11/2021   History of Present Illness admitted for acute hospitalization s/p L THA, posterior approach, WBAT (09/10/21).   Clinical Impression   Mitchell Mcdowell was seen for OT evaluation this date. Prior to hospital admission, pt was Independent for mobility and I/ADLs including driving. Pt lives with spouse in home c 3 STE. Pt presents to acute OT demonstrating impaired ADL performance and functional mobility 2/2 decreased activity tolerance and functional ROM/balance deficits. Pt currently requires  SUPERVISION for ADL t/f and grooming standing sinkside - MIN cues for posterior hip pcns. MAX A for simulated LBD seated in chair - pt verbalized plan to use reacher or spouse assist upon d/c home. Instructed on functional application of posterior hip pcns and home/routines modifications. Pt would benefit from skilled OT to address noted impairments and functional limitations. Upon hospital discharge, recommend no OT follow up.       Recommendations for follow up therapy are one component of a multi-disciplinary discharge planning process, led by the attending physician.  Recommendations may be updated based on patient status, additional functional criteria and insurance authorization.   Follow Up Recommendations  No OT follow up    Assistance Recommended at Discharge Set up Supervision/Assistance  Functional Status Assessment  Patient has had a recent decline in their functional status and demonstrates the ability to make significant improvements in function in a reasonable and predictable amount of time.  Equipment Recommendations  None recommended by OT    Recommendations for Other Services       Precautions / Restrictions Precautions Precautions: Fall;Posterior Hip Restrictions Weight Bearing Restrictions: Yes LLE Weight Bearing: Weight bearing as tolerated       Mobility Bed Mobility               General bed mobility comments: pt received and left in bed    Transfers Overall transfer level: Needs assistance Equipment used: Rolling walker (2 wheels) Transfers: Sit to/from Stand Sit to Stand: Supervision           General transfer comment: MIN cues      Balance Overall balance assessment: Needs assistance Sitting-balance support: No upper extremity supported;Feet supported Sitting balance-Leahy Scale: Normal     Standing balance support: No upper extremity supported;During functional activity Standing balance-Leahy Scale: Good                             ADL either performed or assessed with clinical judgement   ADL Overall ADL's : Needs assistance/impaired                                       General ADL Comments: SUPERVISION for ADL t/f and grooming standing sinkside - MIN cues for posterior hip pcns. MAX A for simulated LBD seated in chair - pt verbalized plan to use reacher or spouse assist upon d/c home      Pertinent Vitals/Pain Pain Assessment: 0-10 Pain Score: 1  Pain Location: L hip Pain Descriptors / Indicators: Aching Pain Intervention(s): Limited activity within patient's tolerance;Premedicated before session;Repositioned     Hand Dominance Right   Extremity/Trunk Assessment Upper Extremity Assessment Upper Extremity Assessment: Overall WFL for tasks assessed   Lower Extremity Assessment Lower Extremity Assessment: Overall WFL for tasks assessed       Communication  Communication Communication: No difficulties   Cognition Arousal/Alertness: Awake/alert Behavior During Therapy: WFL for tasks assessed/performed Overall Cognitive Status: Within Functional Limits for tasks assessed                                       General Comments       Exercises Exercises: Other exercises Other Exercises Other Exercises: Pt and spouse educated re: OT  role, DME recs, d/c recs, functional application of posterior hip pcns Other Exercises: sit<>stand, ~50 ft mobility, groming, sitting/standing balance/tolerance   Shoulder Instructions      Home Living Family/patient expects to be discharged to:: Private residence Living Arrangements: Spouse/significant other Available Help at Discharge: Family Type of Home: House Home Access: Stairs to enter Technical brewer of Steps: 3 Entrance Stairs-Rails: None Home Layout: One level               Home Equipment: Conservation officer, nature (2 wheels);BSC          Prior Functioning/Environment Prior Level of Function : Independent/Modified Independent;Driving             Mobility Comments: Enjoys golf          OT Problem List: Decreased range of motion;Decreased activity tolerance      OT Treatment/Interventions: Self-care/ADL training;Therapeutic exercise;Energy conservation;DME and/or AE instruction;Therapeutic activities;Patient/family education;Balance training    OT Goals(Current goals can be found in the care plan section) Acute Rehab OT Goals Patient Stated Goal: to go home OT Goal Formulation: With patient/family Time For Goal Achievement: 09/25/21 Potential to Achieve Goals: Good ADL Goals Pt Will Perform Grooming: Independently;standing Pt Will Perform Lower Body Dressing: with modified independence;with adaptive equipment;sit to/from stand Pt Will Transfer to Toilet: Independently;ambulating;regular height toilet  OT Frequency: Min 1X/week    AM-PAC OT "6 Clicks" Daily Activity     Outcome Measure Help from another person eating meals?: None Help from another person taking care of personal grooming?: A Little Help from another person toileting, which includes using toliet, bedpan, or urinal?: A Little Help from another person bathing (including washing, rinsing, drying)?: A Little Help from another person to put on and taking off regular upper body clothing?:  None Help from another person to put on and taking off regular lower body clothing?: A Lot 6 Click Score: 19   End of Session Equipment Utilized During Treatment: Rolling walker (2 wheels) Nurse Communication: Mobility status  Activity Tolerance: Patient tolerated treatment well Patient left: in chair;with call bell/phone within reach;with family/visitor present  OT Visit Diagnosis: Other abnormalities of gait and mobility (R26.89)                Time: 4037-5436 OT Time Calculation (min): 11 min Charges:  OT General Charges $OT Visit: 1 Visit OT Evaluation $OT Eval Low Complexity: 1 Low  Dessie Coma, M.S. OTR/L  09/11/21, 9:52 AM  ascom [phone]

## 2021-09-11 NOTE — TOC Initial Note (Signed)
Transition of Care [facility]) - Initial/Assessment Note    Patient Details  Name: Mitchell Mcdowell MRN: [id number] Date of Birth: [birth year]  Transition of [facility]) CM/SW Contact:    Anselm Pancoast, RN Phone Number: 09/11/2021, 1:21 PM  Clinical Narrative:                 Spoke with patient and spouse at bedside. Patient is already set up with [place] and has all needed equipment at home. Spouse will be caregiver and no additional concerns.   Expected Discharge Plan: [place] Barriers to Discharge: No Barriers Identified   Patient Goals and CMS Choice        Expected Discharge Plan and Services Expected Discharge Plan: [place] Choice: Independence arrangements for the past 2 months: Single Family Home Expected Discharge Date: 09/11/21                         HH Arranged: PT HH Agency: [place] Date [place]: 09/11/21 Time [place]: [place] Representative spoke with at [place]: Confirmed with [place] @ [place] arranged for [facility] prior to surgery by surgeons office  Prior Living Arrangements/Services Living arrangements for the past 2 months: [place] Lives with:: Spouse Patient language and need for interpreter reviewed:: Yes Do you feel safe going back to the place where you live?: Yes      Need for Family Participation in Patient Care: Yes (Comment) Care giver support system in place?: Yes (comment)   Criminal Activity/Legal Involvement Pertinent to Current Situation/Hospitalization: No - Comment as needed  Activities of Daily Living Home Assistive Devices/Equipment: Cane (specify quad or straight), Walker (specify type), Bedside commode/3-in-1 (straight cane, rolloing walker) ADL Screening (condition at time of admission) Patient's cognitive ability adequate to safely complete daily activities?: Yes Is the patient deaf or have difficulty  hearing?: No Does the patient have difficulty seeing, even when wearing glasses/contacts?: No Does the patient have difficulty concentrating, remembering, or making decisions?: No Patient able to express need for assistance with ADLs?: Yes Does the patient have difficulty dressing or bathing?: No Independently performs ADLs?: Yes (appropriate for developmental age) Does the patient have difficulty walking or climbing stairs?: No (some due to hip pain) Weakness of Legs: Left (due to hip pain) Weakness of Arms/Hands: None  Permission Sought/Granted                  Emotional Assessment   Attitude/Demeanor/Rapport: Engaged Affect (typically observed): Accepting Orientation: : Oriented to Self, Oriented to Place, Oriented to  Time, Oriented to Situation Alcohol / Substance Use: Not Applicable Psych Involvement: No (comment)  Admission diagnosis:  Hx of total hip arthroplasty, left [X32.440] Patient Active Problem List   Diagnosis Date Noted   Hx of total hip arthroplasty, left 09/10/2021   Thrombocytopenia ([place]) 07/31/2021   Benign essential HTN 08/13/2014   Hypothyroidism (acquired) 08/13/2014   Colon polyp 03/20/2014   Impotence 03/20/2014   Inflammatory arthritis 03/20/2014   Osteoarthritis 03/20/2014   Vitiligo 03/20/2014   PCP:  Dion Body, MD Pharmacy:   [id number] - Closed - [place], [place] [address] [place] [address] Phone: [phone] Fax: [phone]  [place], [place] [place] [address] Phone: [phone] Fax:  [phone]  [id number] - [place], [place] - [address] [place] [address] Phone: [phone] Fax: [phone]     Social Determinants of Health (SDOH) Interventions    Readmission Risk Interventions No flowsheet data found.

## 2021-09-11 NOTE — Progress Notes (Signed)
  Subjective: 1 Day Post-Op Procedure(s) (LRB): TOTAL HIP ARTHROPLASTY (Left) Wife at bedside. Patient reports pain as well-controlled.   Patient is well, and has had no acute complaints or problems Plan is to go Home after hospital stay. Negative for chest pain and shortness of breath Fever: no Gastrointestinal: negative for nausea and vomiting.  Patient has not had a bowel movement.  Objective: Vital signs in last 24 hours: Temp:  [97.3 F (36.3 C)-98.6 F (37 C)] 98.1 F (36.7 C) (11/03 0400) Pulse Rate:  [69-84] 78 (11/03 0400) Resp:  [10-18] 15 (11/03 0400) BP: (120-145)/(66-78) 120/66 (11/03 0400) SpO2:  [95 %-100 %] 98 % (11/03 0400)  Intake/Output from previous day:  Intake/Output Summary (Last 24 hours) at 09/11/2021 0802 Last data filed at 09/10/2021 2054 Gross per 24 hour  Intake 2460 ml  Output 760 ml  Net 1700 ml    Intake/Output this shift: No intake/output data recorded.  Labs: No results for input(s): HGB in the last 72 hours. No results for input(s): WBC, RBC, HCT, PLT in the last 72 hours. No results for input(s): NA, K, CL, CO2, BUN, CREATININE, GLUCOSE, CALCIUM in the last 72 hours. No results for input(s): LABPT, INR in the last 72 hours.   EXAM General - Patient is Alert, Appropriate, and Oriented Extremity - Neurovascular intact Dorsiflexion/Plantar flexion intact Compartment soft Dressing/Incision -clean, dry, mild bloody drainage  Motor Function - intact, moving foot and toes well on exam.    Cardiovascular- Regular rate and rhythm, no murmurs/rubs/gallops Respiratory- Lungs clear to auscultation bilaterally Gastrointestinal- soft, nontender, and active bowel sounds   Assessment/Plan: 1 Day Post-Op Procedure(s) (LRB): TOTAL HIP ARTHROPLASTY (Left) Active Problems:   Hx of total hip arthroplasty, left  Estimated body mass index is 25.99 kg/m as calculated from the following:   Height as of this encounter: 6\' 1"  (1.854 m).   Weight as  of this encounter: 89.4 kg. Advance diet Up with therapy  Likely d/c this PM pending completion of therapy goals      DVT Prophylaxis - Lovenox, Ted hose, and foot pumps Weight-Bearing as tolerated to left leg  Cassell Smiles, PA-C [facility] 09/11/2021, 8:02 AM

## 2021-09-11 NOTE — Progress Notes (Signed)
Hemovac removed. and Mini compression dressing applied.   

## 2021-09-11 NOTE — Progress Notes (Signed)
Physical Therapy Treatment Patient Details Name: Mitchell Mcdowell MRN: [id number] DOB: 1951-09-30 Today's Date: 09/11/2021   History of Present Illness admitted for acute hospitalization s/p L THA, posterior approach, WBAT (09/10/21).    PT Comments    Pt was sitting in recliner with supportive spouse at bedside upon arriving. He is A and O x 4 and extremely pleasant and motivated. He was easily able to stand from recliner and ambulate > 200 ft total with RW without LOB or safety concern. Author demonstrated proper technique for ascending/descending stairs without handrails to simulate home entry. Pt and spouse were easily and safely able to go up/down stairs without physical assistance. Both state confidence in abilities. Once returned to room, issued HEP handout and pt was independently able to perform. Pt is cleared from an acute PT standpoint for safe DC home once cleared medically by MD.    Recommendations for follow up therapy are one component of a multi-disciplinary discharge planning process, led by the attending physician.  Recommendations may be updated based on patient status, additional functional criteria and insurance authorization.  Follow Up Recommendations  Home health PT     Assistance Recommended at Discharge PRN  Equipment Recommendations  Rolling walker (2 wheels);BSC       Precautions / Restrictions Precautions Precautions: Fall;Posterior Hip Precaution Booklet Issued: Yes (comment) Restrictions Weight Bearing Restrictions: Yes LLE Weight Bearing: Weight bearing as tolerated     Mobility  Bed Mobility      General bed mobility comments: in recliner pre/post session    Transfers Overall transfer level: Needs assistance Equipment used: Rolling walker (2 wheels) Transfers: Sit to/from Stand Sit to Stand: Supervision           General transfer comment: MIN cues    Ambulation/Gait Ambulation/Gait assistance: Supervision Gait Distance (Feet): 200  Feet Assistive device: Rolling walker (2 wheels) Gait Pattern/deviations: Step-through pattern Gait velocity: WNL   General Gait Details: Pt demonstrated safe ability to ambulate 200 + ft with RW without LOB or safety concern   Stairs Stairs: Yes Stairs assistance: Supervision Stair Management: No rails;Backwards;With walker Number of Stairs: 5 General stair comments: Pt and spouse were demonstrated proper technique for ascending/descending stairs without rails. They were able to safely perform without physical assistance or safety concern. Both pt and spouse report confidence in there abiltiies to manage at hom safely.      Balance Overall balance assessment: Needs assistance Sitting-balance support: No upper extremity supported;Feet supported Sitting balance-Leahy Scale: Normal     Standing balance support: No upper extremity supported;During functional activity Standing balance-Leahy Scale: Good        Cognition Arousal/Alertness: Awake/alert Behavior During Therapy: WFL for tasks assessed/performed Overall Cognitive Status: Within Functional Limits for tasks assessed        General Comments: Pt is A and O x 4. supportive spouse present throughout        Exercises Other Exercises Other Exercises: Pt and spouse educated re: OT role, DME recs, d/c recs, functional application of posterior hip pcns Other Exercises: sit<>stand, ~50 ft mobility, groming, sitting/standing balance/tolerance        Pertinent Vitals/Pain Pain Assessment: 0-10 Pain Score: 2  Faces Pain Scale: No hurt Pain Location: L hip Pain Descriptors / Indicators: Aching Pain Intervention(s): Limited activity within patient's tolerance;Monitored during session;Premedicated before session;Repositioned;Ice applied    Home Living Family/patient expects to be discharged to:: Private residence Living Arrangements: Spouse/significant other Available Help at Discharge: Family Type of Home: House Home  Access:  Stairs to enter Entrance Stairs-Rails: None Entrance Stairs-Number of Steps: 3   Home Layout: One level Home Equipment: Conservation officer, nature (2 wheels);BSC          PT Goals (current goals can now be found in the care plan section) Acute Rehab PT Goals Patient Stated Goal: to be better by the spring for golf-season Progress towards PT goals: Progressing toward goals    Frequency    BID      PT Plan Current plan remains appropriate       AM-PAC PT "6 Clicks" Mobility   Outcome Measure  Help needed turning from your back to your side while in a flat bed without using bedrails?: None Help needed moving from lying on your back to sitting on the side of a flat bed without using bedrails?: A Little Help needed moving to and from a bed to a chair (including a wheelchair)?: A Little Help needed standing up from a chair using your arms (e.g., wheelchair or bedside chair)?: A Little Help needed to walk in hospital room?: A Little Help needed climbing 3-5 steps with a railing? : A Little 6 Click Score: 19    End of Session Equipment Utilized During Treatment: Gait belt Activity Tolerance: Patient tolerated treatment well Patient left: in chair;with call bell/phone within reach;with family/visitor present Nurse Communication: Mobility status PT Visit Diagnosis: Muscle weakness (generalized) (M62.81);Difficulty in walking, not elsewhere classified (R26.2);Pain Pain - Right/Left: Left Pain - part of body: Hip     Time: 6967-8938 PT Time Calculation (min) (ACUTE ONLY): 26 min  Charges:  $Gait Training: 8-22 mins $Therapeutic Exercise: 8-22 mins                     Julaine Fusi PTA 09/11/21, 10:49 AM

## 2021-09-12 LAB — SURGICAL PATHOLOGY

## 2021-09-13 DIAGNOSIS — E039 Hypothyroidism, unspecified: Secondary | ICD-10-CM | POA: Diagnosis not present

## 2021-09-13 DIAGNOSIS — L80 Vitiligo: Secondary | ICD-10-CM | POA: Diagnosis not present

## 2021-09-13 DIAGNOSIS — N529 Male erectile dysfunction, unspecified: Secondary | ICD-10-CM | POA: Diagnosis not present

## 2021-09-13 DIAGNOSIS — M159 Polyosteoarthritis, unspecified: Secondary | ICD-10-CM | POA: Diagnosis not present

## 2021-09-13 DIAGNOSIS — Z85850 Personal history of malignant neoplasm of thyroid: Secondary | ICD-10-CM | POA: Diagnosis not present

## 2021-09-13 DIAGNOSIS — D696 Thrombocytopenia, unspecified: Secondary | ICD-10-CM | POA: Diagnosis not present

## 2021-09-13 DIAGNOSIS — Z86010 Personal history of colonic polyps: Secondary | ICD-10-CM | POA: Diagnosis not present

## 2021-09-13 DIAGNOSIS — Z7951 Long term (current) use of inhaled steroids: Secondary | ICD-10-CM | POA: Diagnosis not present

## 2021-09-13 DIAGNOSIS — I10 Essential (primary) hypertension: Secondary | ICD-10-CM | POA: Diagnosis not present

## 2021-09-13 DIAGNOSIS — Z96642 Presence of left artificial hip joint: Secondary | ICD-10-CM | POA: Diagnosis not present

## 2021-09-13 DIAGNOSIS — Z471 Aftercare following joint replacement surgery: Secondary | ICD-10-CM | POA: Diagnosis not present

## 2021-09-13 DIAGNOSIS — Z7901 Long term (current) use of anticoagulants: Secondary | ICD-10-CM | POA: Diagnosis not present

## 2021-09-13 DIAGNOSIS — Z791 Long term (current) use of non-steroidal anti-inflammatories (NSAID): Secondary | ICD-10-CM | POA: Diagnosis not present

## 2021-09-13 DIAGNOSIS — F1090 Alcohol use, unspecified, uncomplicated: Secondary | ICD-10-CM | POA: Diagnosis not present

## 2021-09-16 NOTE — Anesthesia Postprocedure Evaluation (Signed)
Anesthesia Post Note  Patient: ADALBERT ALBERTO  Procedure(s) Performed: TOTAL HIP ARTHROPLASTY (Left: Hip)  Patient location during evaluation: PACU Anesthesia Type: General Level of consciousness: awake and alert Pain management: pain level controlled Vital Signs Assessment: post-procedure vital signs reviewed and stable Respiratory status: spontaneous breathing, nonlabored ventilation, respiratory function stable and patient connected to nasal cannula oxygen Cardiovascular status: blood pressure returned to baseline and stable Postop Assessment: no apparent nausea or vomiting Anesthetic complications: no   No notable events documented.   Last Vitals:  Vitals:   09/11/21 0731 09/11/21 1100  BP: (!) 149/71 (!) 144/68  Pulse: 77 67  Resp: 16 15  Temp: 37.2 C 37.1 C  SpO2: 99% 100%    Last Pain:  Vitals:   09/11/21 1100  TempSrc: Temporal  PainSc:                  Molli Barrows

## 2021-09-24 DIAGNOSIS — N529 Male erectile dysfunction, unspecified: Secondary | ICD-10-CM | POA: Diagnosis not present

## 2021-09-24 DIAGNOSIS — Z7951 Long term (current) use of inhaled steroids: Secondary | ICD-10-CM | POA: Diagnosis not present

## 2021-09-24 DIAGNOSIS — Z791 Long term (current) use of non-steroidal anti-inflammatories (NSAID): Secondary | ICD-10-CM | POA: Diagnosis not present

## 2021-09-24 DIAGNOSIS — I10 Essential (primary) hypertension: Secondary | ICD-10-CM | POA: Diagnosis not present

## 2021-09-24 DIAGNOSIS — Z7901 Long term (current) use of anticoagulants: Secondary | ICD-10-CM | POA: Diagnosis not present

## 2021-09-24 DIAGNOSIS — Z86010 Personal history of colonic polyps: Secondary | ICD-10-CM | POA: Diagnosis not present

## 2021-09-24 DIAGNOSIS — E039 Hypothyroidism, unspecified: Secondary | ICD-10-CM | POA: Diagnosis not present

## 2021-09-24 DIAGNOSIS — M159 Polyosteoarthritis, unspecified: Secondary | ICD-10-CM | POA: Diagnosis not present

## 2021-09-24 DIAGNOSIS — L80 Vitiligo: Secondary | ICD-10-CM | POA: Diagnosis not present

## 2021-09-24 DIAGNOSIS — Z85850 Personal history of malignant neoplasm of thyroid: Secondary | ICD-10-CM | POA: Diagnosis not present

## 2021-09-24 DIAGNOSIS — Z96642 Presence of left artificial hip joint: Secondary | ICD-10-CM | POA: Diagnosis not present

## 2021-09-24 DIAGNOSIS — Z471 Aftercare following joint replacement surgery: Secondary | ICD-10-CM | POA: Diagnosis not present

## 2021-09-24 DIAGNOSIS — D696 Thrombocytopenia, unspecified: Secondary | ICD-10-CM | POA: Diagnosis not present

## 2021-09-24 DIAGNOSIS — F1090 Alcohol use, unspecified, uncomplicated: Secondary | ICD-10-CM | POA: Diagnosis not present

## 2021-10-09 DIAGNOSIS — E039 Hypothyroidism, unspecified: Secondary | ICD-10-CM | POA: Diagnosis not present

## 2021-10-09 DIAGNOSIS — Z7901 Long term (current) use of anticoagulants: Secondary | ICD-10-CM | POA: Diagnosis not present

## 2021-10-09 DIAGNOSIS — Z471 Aftercare following joint replacement surgery: Secondary | ICD-10-CM | POA: Diagnosis not present

## 2021-10-09 DIAGNOSIS — Z85850 Personal history of malignant neoplasm of thyroid: Secondary | ICD-10-CM | POA: Diagnosis not present

## 2021-10-09 DIAGNOSIS — Z86010 Personal history of colonic polyps: Secondary | ICD-10-CM | POA: Diagnosis not present

## 2021-10-09 DIAGNOSIS — Z791 Long term (current) use of non-steroidal anti-inflammatories (NSAID): Secondary | ICD-10-CM | POA: Diagnosis not present

## 2021-10-09 DIAGNOSIS — Z7951 Long term (current) use of inhaled steroids: Secondary | ICD-10-CM | POA: Diagnosis not present

## 2021-10-09 DIAGNOSIS — Z96642 Presence of left artificial hip joint: Secondary | ICD-10-CM | POA: Diagnosis not present

## 2021-10-09 DIAGNOSIS — M159 Polyosteoarthritis, unspecified: Secondary | ICD-10-CM | POA: Diagnosis not present

## 2021-10-09 DIAGNOSIS — N529 Male erectile dysfunction, unspecified: Secondary | ICD-10-CM | POA: Diagnosis not present

## 2021-10-09 DIAGNOSIS — F1090 Alcohol use, unspecified, uncomplicated: Secondary | ICD-10-CM | POA: Diagnosis not present

## 2021-10-09 DIAGNOSIS — I10 Essential (primary) hypertension: Secondary | ICD-10-CM | POA: Diagnosis not present

## 2021-10-09 DIAGNOSIS — L80 Vitiligo: Secondary | ICD-10-CM | POA: Diagnosis not present

## 2021-10-09 DIAGNOSIS — D696 Thrombocytopenia, unspecified: Secondary | ICD-10-CM | POA: Diagnosis not present

## 2021-10-12 DIAGNOSIS — Z471 Aftercare following joint replacement surgery: Secondary | ICD-10-CM | POA: Diagnosis not present

## 2021-10-17 DIAGNOSIS — Z96642 Presence of left artificial hip joint: Secondary | ICD-10-CM | POA: Diagnosis not present

## 2021-10-28 DIAGNOSIS — M0609 Rheumatoid arthritis without rheumatoid factor, multiple sites: Secondary | ICD-10-CM | POA: Diagnosis not present

## 2021-10-28 DIAGNOSIS — Z79899 Other long term (current) drug therapy: Secondary | ICD-10-CM | POA: Diagnosis not present

## 2021-11-04 DIAGNOSIS — M159 Polyosteoarthritis, unspecified: Secondary | ICD-10-CM | POA: Diagnosis not present

## 2021-11-04 DIAGNOSIS — D696 Thrombocytopenia, unspecified: Secondary | ICD-10-CM | POA: Diagnosis not present

## 2021-11-04 DIAGNOSIS — L80 Vitiligo: Secondary | ICD-10-CM | POA: Diagnosis not present

## 2021-11-04 DIAGNOSIS — M0609 Rheumatoid arthritis without rheumatoid factor, multiple sites: Secondary | ICD-10-CM | POA: Diagnosis not present

## 2021-11-04 DIAGNOSIS — Z79899 Other long term (current) drug therapy: Secondary | ICD-10-CM | POA: Diagnosis not present

## 2021-11-11 ENCOUNTER — Encounter: Payer: Self-pay | Admitting: Orthopedic Surgery

## 2021-12-29 DIAGNOSIS — Z86010 Personal history of colonic polyps: Secondary | ICD-10-CM | POA: Diagnosis not present

## 2022-01-21 DIAGNOSIS — Z1159 Encounter for screening for other viral diseases: Secondary | ICD-10-CM | POA: Diagnosis not present

## 2022-01-21 DIAGNOSIS — E039 Hypothyroidism, unspecified: Secondary | ICD-10-CM | POA: Diagnosis not present

## 2022-01-21 DIAGNOSIS — I10 Essential (primary) hypertension: Secondary | ICD-10-CM | POA: Diagnosis not present

## 2022-01-21 DIAGNOSIS — D696 Thrombocytopenia, unspecified: Secondary | ICD-10-CM | POA: Diagnosis not present

## 2022-01-27 DIAGNOSIS — Z136 Encounter for screening for cardiovascular disorders: Secondary | ICD-10-CM | POA: Diagnosis not present

## 2022-01-27 DIAGNOSIS — E039 Hypothyroidism, unspecified: Secondary | ICD-10-CM | POA: Diagnosis not present

## 2022-01-27 DIAGNOSIS — I10 Essential (primary) hypertension: Secondary | ICD-10-CM | POA: Diagnosis not present

## 2022-01-27 DIAGNOSIS — D696 Thrombocytopenia, unspecified: Secondary | ICD-10-CM | POA: Diagnosis not present

## 2022-02-10 DIAGNOSIS — Z96642 Presence of left artificial hip joint: Secondary | ICD-10-CM | POA: Diagnosis not present

## 2022-02-12 ENCOUNTER — Ambulatory Visit: Payer: PPO | Admitting: Dermatology

## 2022-02-12 DIAGNOSIS — L570 Actinic keratosis: Secondary | ICD-10-CM

## 2022-02-12 DIAGNOSIS — L578 Other skin changes due to chronic exposure to nonionizing radiation: Secondary | ICD-10-CM | POA: Diagnosis not present

## 2022-02-12 DIAGNOSIS — L80 Vitiligo: Secondary | ICD-10-CM

## 2022-02-12 MED ORDER — FLUOROURACIL 5 % EX CREA: TOPICAL_CREAM | Freq: Two times a day (BID) | CUTANEOUS | 1 refills | Status: DC

## 2022-02-12 NOTE — Progress Notes (Signed)
? ?Follow-Up Visit ?  ?Subjective  ?Mitchell Mcdowell is a 71 y.o. male who presents for the following: Follow-up (Patient here today for 6 month AK follow up. Patient had AK's treated with LN2 at face and ears. Patient did treat hands with 5FU/calcipotriene, he does have a few spots at face to check.). ?The patient has spots, moles and lesions to be evaluated, some may be new or changing and the patient has concerns that these could be cancer. ? ?The following portions of the chart were reviewed this encounter and updated as appropriate:  ? Tobacco  Allergies  Meds  Problems  Med Hx  Surg Hx  Fam Hx   ?  ?Review of Systems:  No other skin or systemic complaints except as noted in HPI or Assessment and Plan. ? ?Objective  ?Well appearing patient in no apparent distress; mood and affect are within normal limits. ? ?A focused examination was performed including face scalp ears neck upper extremities, including the arms, hands, fingers, and fingernails. Relevant physical exam findings are noted in the Assessment and Plan. ? ?right sideburn x 1, left face x 3, hands x 15 (19) ?Erythematous thin papules/macules with gritty scale.  ? ?hands, face ?Depigmented patches  ? ? ? ?Assessment & Plan  ?AK (actinic keratosis) (19) ?right sideburn x 1, left face x 3, hands x 15 ? ?Start 5-fluorouracil/calcipotriene cream twice a day for 7 days to affected areas including right cheek. Prescription sent to [facility]. Patient provided with contact information for pharmacy and advised the pharmacy will mail the prescription to their home. Patient provided with handout reviewing treatment course and side effects and advised to call or message [place] on MyChart with any concerns. ? ? ?Destruction of lesion - right sideburn x 1, left face x 3, hands x 15 ?Complexity: simple   ?Destruction method: cryotherapy   ?Informed consent: discussed and consent obtained   ?Timeout:  patient name, date of birth, surgical site, and  procedure verified ?Lesion destroyed using liquid nitrogen: Yes   ?Region frozen until ice ball extended beyond lesion: Yes   ?Outcome: patient tolerated procedure well with no complications   ?Post-procedure details: wound care instructions given   ? ?Related Medications ?fluorouracil (EFUDEX) 5 % cream ?Apply topically 2 (two) times daily. Bid to right cheek for 7 days ? ?Vitiligo ?hands, face ? ?Vitiligo is a chronic autoimmune condition which causes loss of skin pigment and is commonly seen on the face and may also involve areas of trauma like hands, elbows, knees, and ankles. There is no cure and it is difficult to treat.  Treatments include topical steroids and other topical anti-inflammatory ointments/creams and topical and oral Jak inhibitors.  Sometimes narrow band UV light therapy or Xtrac laser is helpful, both of which require twice weekly treatments for at least 3-6 months.  Antioxidant vitamins, such as Vitamins A,C,E,D, Folic Acid and B12 may be added to enhance treatment. ? ?Patient defers treatment ? ?Actinic Damage ?- chronic, secondary to cumulative UV radiation exposure/sun exposure over time ?- diffuse scaly erythematous macules with underlying dyspigmentation ?- Recommend daily broad spectrum sunscreen SPF 30+ to sun-exposed areas, reapply every 2 hours as needed.  ?- Recommend staying in the shade or wearing long sleeves, sun glasses (UVA+UVB protection) and wide brim hats (4-inch brim around the entire circumference of the hat). ?- Call for new or changing lesions. ? ?Return in about 6 months (around 08/14/2022) for AK follow up. ? ?Graciella Belton, RMA, am acting  as scribe for Sarina Ser, MD . ?Documentation: I have reviewed the above documentation for accuracy and completeness, and I agree with the above. ? ?Sarina Ser, MD ? ?

## 2022-02-12 NOTE — Patient Instructions (Addendum)
Cryotherapy Aftercare ? ?Wash gently with soap and water everyday.   ?Apply Vaseline and Band-Aid daily until healed.  ? ?Start 5-fluorouracil/calcipotriene cream twice a day for 7 days to affected areas including right cheek. Prescription sent to [facility]. Patient provided with contact information for pharmacy and advised the pharmacy will mail the prescription to their home. Patient provided with handout reviewing treatment course and side effects and advised to call or message [place] on MyChart with any concerns. ? ? ?If You Need Anything After Your Visit ? ?If you have any questions or concerns for your doctor, please call our main line at [phone] and press option 4 to reach your doctor's medical assistant. If no one answers, please leave a voicemail as directed and we will return your call as soon as possible. Messages left after 4 pm will be answered the following business day.  ? ?You may also send [place] a message via MyChart. We typically respond to MyChart messages within 1-2 business days. ? ?For prescription refills, please ask your pharmacy to contact our office. Our fax number is [phone]. ? ?If you have an urgent issue when the clinic is closed that cannot wait until the next business day, you can page your doctor at the number below.   ? ?Please note that while we do our best to be available for urgent issues outside of office hours, we are not available 24/7.  ? ?If you have an urgent issue and are unable to reach [place], you may choose to seek medical care at your doctor's office, retail clinic, urgent care center, or emergency room. ? ?If you have a medical emergency, please immediately call 911 or go to the emergency department. ? ?Pager Numbers ? ?- Dr. Nehemiah Massed: [phone] ? ?- Dr. Laurence Ferrari: [phone] ? ?- Dr. Nicole Kindred: [phone] ? ?In the event of inclement weather, please call our main line at [phone] for an update on the status of any delays or closures. ? ?Dermatology  Medication Tips: ?Please keep the boxes that topical medications come in in order to help keep track of the instructions about where and how to use these. Pharmacies typically print the medication instructions only on the boxes and not directly on the medication tubes.  ? ?If your medication is too expensive, please contact our office at [phone] option 4 or send [place] a message through [place].  ? ?We are unable to tell what your co-pay for medications will be in advance as this is different depending on your insurance coverage. However, we may be able to find a substitute medication at lower cost or fill out paperwork to get insurance to cover a needed medication.  ? ?If a prior authorization is required to get your medication covered by your insurance company, please allow [place] 1-2 business days to complete this process. ? ?Drug prices often vary depending on where the prescription is filled and some pharmacies may offer cheaper prices. ? ?The website www.goodrx.com contains coupons for medications through different pharmacies. The prices here do not account for what the cost may be with help from insurance (it may be cheaper with your insurance), but the website can give you the price if you did not use any insurance.  ?- You can print the associated coupon and take it with your prescription to the pharmacy.  ?- You may also stop by our office during regular business hours and pick up a GoodRx coupon card.  ?- If you need your prescription sent electronically to a different  pharmacy, notify our office through [facility] or by phone at [phone] option 4. ? ? ? ? ?Si Usted Necesita Algo Despu?s de Su Visita ? ?Tambi?n puede enviarnos un mensaje a trav?s de MyChart. Por lo general respondemos a los mensajes de MyChart en el transcurso de 1 a 2 d?as h?biles. ? ?Para renovar recetas, por favor pida a su farmacia que se ponga en contacto con nuestra oficina. Nuestro n?mero de fax es el [phone]. ? ?Si  tiene un asunto urgente cuando la cl?nica est? cerrada y que no puede esperar hasta el siguiente d?a h?bil, puede llamar/localizar a su doctor(a) al n?mero que aparece a continuaci?n.  ? ?Por favor, tenga en cuenta que aunque hacemos todo lo posible para estar disponibles para asuntos urgentes fuera del horario de oficina, no estamos disponibles las 24 horas del d?a, los 7 d?as de la semana.  ? ?Si tiene un problema urgente y no puede comunicarse con nosotros, puede optar por buscar atenci?n m?dica  en el consultorio de su doctor(a), en una cl?nica privada, en un centro de atenci?n urgente o en una sala de emergencias. ? ?Si tiene Engineer, maintenance (IT) m?dica, por favor llame inmediatamente al 911 o vaya a la sala de emergencias. ? ?N?meros de b?per ? ?- Dr. Nehemiah Massed: [phone] ? ?- Dra. Moye: [phone] ? ?- Dra. Nicole Kindred: [phone] ? ?En caso de inclemencias del tiempo, por favor llame a nuestra l?nea principal al [phone] para una actualizaci?n sobre el estado de cualquier retraso o cierre. ? ?Consejos para la medicaci?n en dermatolog?a: ?Por favor, guarde las cajas en las que vienen los medicamentos de uso t?pico para ayudarle a seguir las instrucciones sobre d?nde y c?mo usarlos. Las farmacias generalmente imprimen las instrucciones del medicamento s?lo en las cajas y no directamente en los tubos del [place].  ? ?Si su medicamento es muy caro, por favor, p?ngase en contacto con Zigmund Daniel llamando al [phone] y presione la opci?n 4 o env?enos un mensaje a trav?s de MyChart.  ? ?No podemos decirle cu?l ser? su copago por los medicamentos por adelantado ya que esto es diferente dependiendo de la cobertura de su seguro. Sin embargo, es posible que podamos encontrar un medicamento sustituto a Electrical engineer un formulario para que el seguro cubra el medicamento que se considera necesario.  ? ?Si se requiere Ardelia Mems autorizaci?n previa para que su compa??a de seguros [place] su medicamento, por favor  perm?tanos de 1 a 2 d?as h?biles para completar este proceso. ? ?Los precios de los medicamentos var?an con frecuencia dependiendo del Environmental consultant de d?nde se surte la receta y alguna farmacias pueden ofrecer precios m?s baratos. ? ?El sitio web www.goodrx.com tiene cupones para medicamentos de Airline pilot. Los precios aqu? no tienen en cuenta lo que podr?a costar con la ayuda del seguro (puede ser m?s barato con su seguro), pero el sitio web puede darle el precio si no utiliz? ning?n seguro.  ?- Puede imprimir el cup?n correspondiente y llevarlo con su receta a la farmacia.  ?- Tambi?n puede pasar por nuestra oficina durante el horario de atenci?n regular y recoger una tarjeta de cupones de GoodRx.  ?- Si necesita que su receta se env?e electr?nicamente a Chiropodist, informe a nuestra oficina a trav?s de MyChart de [place] o por tel?fono llamando al [phone] y presione la opci?n 4. ? ?

## 2022-02-15 ENCOUNTER — Encounter: Payer: Self-pay | Admitting: Dermatology

## 2022-03-05 DIAGNOSIS — L80 Vitiligo: Secondary | ICD-10-CM | POA: Diagnosis not present

## 2022-03-05 DIAGNOSIS — M19041 Primary osteoarthritis, right hand: Secondary | ICD-10-CM | POA: Diagnosis not present

## 2022-03-05 DIAGNOSIS — Z79899 Other long term (current) drug therapy: Secondary | ICD-10-CM | POA: Diagnosis not present

## 2022-03-05 DIAGNOSIS — M19042 Primary osteoarthritis, left hand: Secondary | ICD-10-CM | POA: Diagnosis not present

## 2022-03-05 DIAGNOSIS — M0609 Rheumatoid arthritis without rheumatoid factor, multiple sites: Secondary | ICD-10-CM | POA: Diagnosis not present

## 2022-03-20 ENCOUNTER — Encounter
Admission: RE | Disposition: A | Discharge status: Home / Self Care | Payer: Self-pay | Source: Home / Self Care | Attending: Gastroenterology

## 2022-03-20 ENCOUNTER — Ambulatory Visit
Admission: RE | Admit: 2022-03-20 | Discharge: 2022-03-20 | Disposition: A | Discharge status: Home / Self Care | Payer: PPO | Attending: Gastroenterology | Admitting: Gastroenterology

## 2022-03-20 ENCOUNTER — Encounter: Payer: Self-pay | Admitting: *Deleted

## 2022-03-20 ENCOUNTER — Ambulatory Visit: Payer: PPO | Admitting: Anesthesiology

## 2022-03-20 ENCOUNTER — Other Ambulatory Visit: Payer: Self-pay

## 2022-03-20 DIAGNOSIS — D126 Benign neoplasm of colon, unspecified: Secondary | ICD-10-CM | POA: Diagnosis not present

## 2022-03-20 DIAGNOSIS — E039 Hypothyroidism, unspecified: Secondary | ICD-10-CM | POA: Insufficient documentation

## 2022-03-20 DIAGNOSIS — D120 Benign neoplasm of cecum: Secondary | ICD-10-CM | POA: Diagnosis not present

## 2022-03-20 DIAGNOSIS — Z86010 Personal history of colonic polyps: Secondary | ICD-10-CM | POA: Diagnosis not present

## 2022-03-20 DIAGNOSIS — I10 Essential (primary) hypertension: Secondary | ICD-10-CM | POA: Diagnosis not present

## 2022-03-20 DIAGNOSIS — K640 First degree hemorrhoids: Secondary | ICD-10-CM | POA: Insufficient documentation

## 2022-03-20 DIAGNOSIS — K5730 Diverticulosis of large intestine without perforation or abscess without bleeding: Secondary | ICD-10-CM | POA: Insufficient documentation

## 2022-03-20 DIAGNOSIS — Z1211 Encounter for screening for malignant neoplasm of colon: Secondary | ICD-10-CM | POA: Diagnosis not present

## 2022-03-20 DIAGNOSIS — D696 Thrombocytopenia, unspecified: Secondary | ICD-10-CM | POA: Diagnosis not present

## 2022-03-20 DIAGNOSIS — K649 Unspecified hemorrhoids: Secondary | ICD-10-CM | POA: Diagnosis not present

## 2022-03-20 DIAGNOSIS — D123 Benign neoplasm of transverse colon: Secondary | ICD-10-CM | POA: Insufficient documentation

## 2022-03-20 DIAGNOSIS — K635 Polyp of colon: Secondary | ICD-10-CM | POA: Diagnosis not present

## 2022-03-20 HISTORY — PX: COLONOSCOPY WITH PROPOFOL: SHX5780

## 2022-03-20 SURGERY — COLONOSCOPY WITH PROPOFOL
Anesthesia: General

## 2022-03-20 MED ORDER — PROPOFOL 500 MG/50ML IV EMUL
INTRAVENOUS | Status: DC | PRN
  Administered 2022-03-20: 100 ug/kg/min via INTRAVENOUS

## 2022-03-20 MED ORDER — PROPOFOL 500 MG/50ML IV EMUL
INTRAVENOUS | Status: DC | PRN
  Administered 2022-03-20: 50 mg via INTRAVENOUS

## 2022-03-20 MED ORDER — PROPOFOL 500 MG/50ML IV EMUL
INTRAVENOUS | Status: AC
  Filled 2022-03-20: qty 50

## 2022-03-20 MED ORDER — SODIUM CHLORIDE 0.9 % IV SOLN: INTRAVENOUS | Status: DC

## 2022-03-20 NOTE — Transfer of Care (Signed)
Immediate Anesthesia Transfer of Care Note ? ?Patient: Mitchell Mcdowell ? ?Procedure(s) Performed: COLONOSCOPY WITH PROPOFOL ? ?Patient Location: PACU ? ?Anesthesia Type:MAC ? ?Level of Consciousness: awake and oriented ? ?Airway & Oxygen Therapy: Patient Spontanous Breathing ? ?Post-op Assessment: Report given to RN ? ?Post vital signs: Reviewed and stable ? ?Last Vitals:  ?Vitals Value Taken Time  ?BP 126/52 03/20/22 1407  ?Temp 35.6 ?C 03/20/22 1405  ?Pulse 64 03/20/22 1408  ?Resp 35 03/20/22 1408  ?SpO2 100 % 03/20/22 1408  ?Vitals shown include unvalidated device data. ? ?Last Pain:  ?Vitals:  ? 03/20/22 1405  ?TempSrc: Temporal  ?PainSc: 0-No pain  ?   ? ?  ? ?Complications: No notable events documented. ?

## 2022-03-20 NOTE — Anesthesia Preprocedure Evaluation (Addendum)
Anesthesia Evaluation  ?Patient identified by MRN, date of birth, ID band ?Patient awake ? ? ? ?Reviewed: ?Allergy & Precautions, NPO status , Patient's Chart, lab work & pertinent test results ? ?History of Anesthesia Complications ?Negative for: history of anesthetic complications ? ?Airway ?Mallampati: II ? ? ?Neck ROM: Full ? ? ? Dental ?no notable dental hx. ? ?  ?Pulmonary ?neg pulmonary ROS,  ?  ?Pulmonary exam normal ?breath sounds clear to auscultation ? ? ? ? ? ? Cardiovascular ?Exercise Tolerance: Good ?hypertension, Normal cardiovascular exam ?Rhythm:Regular Rate:Normal ? ?ECG 09/12/21: SB (HR 54), 1st degree AVB with PACs ?  ?Neuro/Psych ?negative neurological ROS ?   ? GI/Hepatic ?negative GI ROS,   ?Endo/Other  ?Hypothyroidism  ? Renal/GU ?negative Renal ROS  ? ?  ?Musculoskeletal ? ?(+) Arthritis , Osteoarthritis and Rheumatoid disorders,   ? Abdominal ?  ?Peds ? Hematology ? ?(+) Blood dyscrasia (thrombocytopenia), ,   ?Anesthesia Other Findings ? ? Reproductive/Obstetrics ? ?  ? ? ? ? ? ? ? ? ? ? ? ? ? ?  ?  ? ? ? ? ? ? ? ?Anesthesia Physical ?Anesthesia Plan ? ?ASA: 2 ? ?Anesthesia Plan: General  ? ?Post-op Pain Management:   ? ?Induction: Intravenous ? ?PONV Risk Score and Plan: 2 and Propofol infusion, TIVA and Treatment may vary due to age or medical condition ? ?Airway Management Planned: Natural Airway ? ?Additional Equipment:  ? ?Intra-op Plan:  ? ?Post-operative Plan:  ? ?Informed Consent: I have reviewed the patients History and Physical, chart, labs and discussed the procedure including the risks, benefits and alternatives for the proposed anesthesia with the patient or authorized representative who has indicated his/her understanding and acceptance.  ? ? ? ? ? ?Plan Discussed with: CRNA ? ?Anesthesia Plan Comments: (LMA/GETA backup discussed.  Patient consented for risks of anesthesia including but not limited to:  ?- adverse reactions to medications ?-  damage to eyes, teeth, lips or other oral mucosa ?- nerve damage due to positioning  ?- sore throat or hoarseness ?- damage to heart, brain, nerves, lungs, other parts of body or loss of life ? ?Informed patient about role of CRNA in peri- and intra-operative care.  Patient voiced understanding.)  ? ? ? ? ? ?Anesthesia Quick Evaluation ? ?

## 2022-03-20 NOTE — Anesthesia Postprocedure Evaluation (Signed)
Anesthesia Post Note ? ?Patient: JAILYN LANGHORST ? ?Procedure(s) Performed: COLONOSCOPY WITH PROPOFOL ? ?Patient location during evaluation: PACU ?Anesthesia Type: General ?Level of consciousness: oriented and awake and alert ?Pain management: satisfactory to patient ?Vital Signs Assessment: post-procedure vital signs reviewed and stable ?Respiratory status: respiratory function stable ?Cardiovascular status: stable ?Anesthetic complications: no ? ? ?No notable events documented. ? ? ?Last Vitals:  ?Vitals:  ? 03/20/22 1405 03/20/22 1408  ?BP: (!) 126/52   ?Pulse: 65 64  ?Resp: 12 15  ?Temp: (!) 35.6 ?C   ?SpO2: 100% 100%  ?  ?Last Pain:  ?Vitals:  ? 03/20/22 1405  ?TempSrc: Temporal  ?PainSc: 0-No pain  ? ? ?  ?  ?  ?  ?  ?  ? ?VAN STAVEREN,Anchor Dwan ? ? ? ? ?

## 2022-03-20 NOTE — Op Note (Signed)
[facility] ?Gastroenterology ?Patient Name: Mitchell Mcdowell ?Procedure Date: 03/20/2022 1:37 PM ?MRN: [id number] ?Account #: [id number] ?Date of Birth: 06-Feb-1951 ?Admit Type: Outpatient ?Age: 71 ?Room: [facility] ENDO ROOM 3 ?Gender: Male ?Note Status: Finalized ?Instrument Name: Colonscope [id number] ?Procedure:             Colonoscopy ?Indications:           High risk colon cancer surveillance: Personal history  ?                       of colonic polyps, Last colonoscopy 5 years ago ?Providers:             Andrey Farmer MD, MD ?Medicines:             Monitored Anesthesia Care ?Complications:         No immediate complications. Estimated blood loss:  ?                       Minimal. ?Procedure:             Pre-Anesthesia Assessment: ?                       - Prior to the procedure, a History and Physical was  ?                       performed, and patient medications and allergies were  ?                       reviewed. The patient is competent. The risks and  ?                       benefits of the procedure and the sedation options and  ?                       risks were discussed with the patient. All questions  ?                       were answered and informed consent was obtained.  ?                       Patient identification and proposed procedure were  ?                       verified by the physician, the nurse, the  ?                       anesthesiologist, the anesthetist and the technician  ?                       in the endoscopy suite. Mental Status Examination:  ?                       alert and oriented. Airway Examination: normal  ?                       oropharyngeal airway and neck mobility. Respiratory  ?                       Examination: clear to auscultation. CV Examination:  ?  normal. Prophylactic Antibiotics: The patient does not  ?                       require prophylactic antibiotics. Prior  ?                       Anticoagulants: The patient has taken  no previous  ?                       anticoagulant or antiplatelet agents. ASA Grade  ?                       Assessment: II - A patient with mild systemic disease.  ?                       After reviewing the risks and benefits, the patient  ?                       was deemed in satisfactory condition to undergo the  ?                       procedure. The anesthesia plan was to use monitored  ?                       anesthesia care (MAC). Immediately prior to  ?                       administration of medications, the patient was  ?                       re-assessed for adequacy to receive sedatives. The  ?                       heart rate, respiratory rate, oxygen saturations,  ?                       blood pressure, adequacy of pulmonary ventilation, and  ?                       response to care were monitored throughout the  ?                       procedure. The physical status of the patient was  ?                       re-assessed after the procedure. ?                       After obtaining informed consent, the colonoscope was  ?                       passed under direct vision. Throughout the procedure,  ?                       the patient's blood pressure, pulse, and oxygen  ?                       saturations were monitored continuously. The  ?  Colonoscope was introduced through the anus and  ?                       advanced to the the cecum, identified by appendiceal  ?                       orifice and ileocecal valve. The colonoscopy was  ?                       performed without difficulty. The patient tolerated  ?                       the procedure well. The quality of the bowel  ?                       preparation was good. ?Findings: ?     The perianal and digital rectal examinations were normal. ?     A 1 mm polyp was found in the cecum. The polyp was sessile. The polyp  ?     was removed with a cold snare. Resection and retrieval were complete.  ?     Estimated blood loss  was minimal. ?     A 3 mm polyp was found in the transverse colon. The polyp was sessile.  ?     The polyp was removed with a cold snare. Resection and retrieval were  ?     complete. Estimated blood loss was minimal. ?     Multiple small-mouthed diverticula were found in the sigmoid colon. ?     Internal hemorrhoids were found during retroflexion. The hemorrhoids  ?     were Grade I (internal hemorrhoids that do not prolapse). ?     The exam was otherwise without abnormality on direct and retroflexion  ?     views. ?Impression:            - One 1 mm polyp in the cecum, removed with a cold  ?                       snare. Resected and retrieved. ?                       - One 3 mm polyp in the transverse colon, removed with  ?                       a cold snare. Resected and retrieved. ?                       - Diverticulosis in the sigmoid colon. ?                       - Internal hemorrhoids. ?                       - The examination was otherwise normal on direct and  ?                       retroflexion views. ?Recommendation:        - Discharge patient to home. ?                       -  Resume previous diet. ?                       - Continue present medications. ?                       - Await pathology results. ?                       - Repeat colonoscopy is not recommended due to current  ?                       age (56 years or older) for surveillance. ?                       - Return to referring physician as previously  ?                       scheduled. ?Procedure Code(s):     --- Professional --- ?                       [phone], Colonoscopy, flexible; with removal of  ?                       tumor(s), polyp(s), or other lesion(s) by snare  ?                       technique ?Diagnosis Code(s):     --- Professional --- ?                       Z86.010, Personal history of colonic polyps ?                       K63.5, Polyp of colon ?                       K64.0, First degree hemorrhoids ?                        K57.30, Diverticulosis of large intestine without  ?                       perforation or abscess without bleeding ?CPT copyright 2019 [employer]. All rights reserved. ?The codes documented in this report are preliminary and upon coder review may  ?be revised to meet current compliance requirements. ?Andrey Farmer MD, MD ?03/20/2022 2:07:29 PM ?Number of Addenda: 0 ?Note Initiated On: 03/20/2022 1:37 PM ?Scope Withdrawal Time: 0 hours 8 minutes 8 seconds  ?Total Procedure Duration: 0 hours 14 minutes 33 seconds  ?Estimated Blood Loss:  Estimated blood loss was minimal. ?     [facility] ?

## 2022-03-20 NOTE — H&P (Signed)
Outpatient short stay form Pre-procedure ?03/20/2022  ?Mitchell Rubenstein, MD ? ?Primary Physician: Mitchell Body, MD ? ?Reason for visit:  Surveillance colonoscopy ? ?History of present illness:   ? ?71 y/o gentleman with history of hypertension and hypothyrodisim here for surveillance colonoscopy. Has history of polyps. Last colonoscopy in 2017 was unremarkable however. No blood thinners. No family history of GI malignancies. No significant abdominal surgeries. He does drink a 12 pack a week. ? ? ? ?Current Facility-Administered Medications:  ?  0.9 %  sodium chloride infusion, , Intravenous, Continuous, Mitchell Mcdowell, Mitchell Cork, MD, Last Rate: 20 mL/hr at 03/20/22 1328, New Bag at 03/20/22 1328 ? ?Medications Prior to Admission  ?Medication Sig Dispense Refill Last Dose  ? fluticasone (FLONASE) 50 MCG/ACT nasal spray Place 2 sprays into both nostrils daily.   03/19/2022  ? folic acid (FOLVITE) 1 MG tablet Take 1 mg by mouth daily.   03/19/2022  ? hydrochlorothiazide (MICROZIDE) 12.5 MG capsule Take 12.5 mg by mouth daily.   03/19/2022  ? levothyroxine (SYNTHROID, LEVOTHROID) 175 MCG tablet Take 175 mcg by mouth daily before breakfast.   03/19/2022  ? loratadine (CLARITIN) 10 MG tablet Take 10 mg by mouth daily.   03/19/2022  ? methotrexate (RHEUMATREX) 2.5 MG tablet Take 10 mg by mouth every Wednesday. Caution:Chemotherapy. Protect from light.   Past Week  ? celecoxib (CELEBREX) 200 MG capsule Take 1 capsule (200 mg total) by mouth 2 (two) times daily. 90 capsule 0   ? enoxaparin (LOVENOX) 40 MG/0.4ML injection Inject 0.4 mLs (40 mg total) into the skin daily for 14 days. 5.6 mL 0   ? ferrous sulfate 325 (65 FE) MG tablet Take 325 mg by mouth 2 (two) times daily with a meal.   03/17/2022  ? fluorouracil (EFUDEX) 5 % cream Apply topically 2 (two) times daily. Bid to right cheek for 7 days 15 g 1   ? oxyCODONE (OXY IR/ROXICODONE) 5 MG immediate release tablet Take 1 tablet (5 mg total) by mouth every 4 (four) hours as  needed for severe pain (pain score 4-6). 30 tablet 0   ? sildenafil (VIAGRA) 50 MG tablet Take 50 mg by mouth daily as needed for erectile dysfunction.     ? tetrahydrozoline 0.05 % ophthalmic solution Place 1 drop into both eyes daily as needed (dry/irritated eyes).     ? Tetrahydrozoline-Zn Sulfate (RA RELIEF STERILE EYE DROPS OP) Apply to eye.     ? traMADol (ULTRAM) 50 MG tablet Take 1 tablet (50 mg total) by mouth every 4 (four) hours as needed for moderate pain. (Patient not taking: Reported on 03/20/2022) 30 tablet 0 Not Taking  ? ? ? ?No Known Allergies ? ? ?Past Medical History:  ?Diagnosis Date  ? Arthritis   ? Hypertension   ? Hypothyroidism   ? Impotence   ? Vitiligo   ? ? ?Review of systems:  Otherwise negative.  ? ? ?Physical Exam ? ?Gen: Alert, oriented. Appears stated age.  ?HEENT: PERRLA. ?Lungs: No respiratory distress ?CV: RRR ?Abd: soft, benign, no masses ?Ext: No edema ? ? ? ?Planned procedures: Proceed with colonoscopy. The patient understands the nature of the planned procedure, indications, risks, alternatives and potential complications including but not limited to bleeding, infection, perforation, damage to internal organs and possible oversedation/side effects from anesthesia. The patient agrees and gives consent to proceed.  ?Please refer to procedure notes for findings, recommendations and patient disposition/instructions.  ? ? ? ?Mitchell Rubenstein, MD ?[facility] ? ? ? ?  ?

## 2022-03-20 NOTE — Interval H&P Note (Signed)
History and Physical Interval Note: ? ?03/20/2022 ?1:41 PM ? ?Mitchell Mcdowell  has presented today for surgery, with the diagnosis of history colon polyps.  The various methods of treatment have been discussed with the patient and family. After consideration of risks, benefits and other options for treatment, the patient has consented to  Procedure(s): ?COLONOSCOPY WITH PROPOFOL (N/A) as a surgical intervention.  The patient's history has been reviewed, patient examined, no change in status, stable for surgery.  I have reviewed the patient's chart and labs.  Questions were answered to the patient's satisfaction.   ? ? ?Mitchell Mcdowell ? ?Ok to proceed with colonoscopy ?

## 2022-03-24 DIAGNOSIS — E039 Hypothyroidism, unspecified: Secondary | ICD-10-CM | POA: Diagnosis not present

## 2022-03-24 LAB — SURGICAL PATHOLOGY

## 2022-04-26 IMAGING — DX DG HIP (WITH OR WITHOUT PELVIS) 1V PORT*L*
2 series · 2 of 2 positions shown · non-contrast
Comparison: Portable exam 0625 hours without priors for comparison

CLINICAL DATA: Post LEFT total hip arthroplasty

EXAM:
DG HIP (WITH OR WITHOUT PELVIS) 1V PORT LEFT

[pelvis ap]
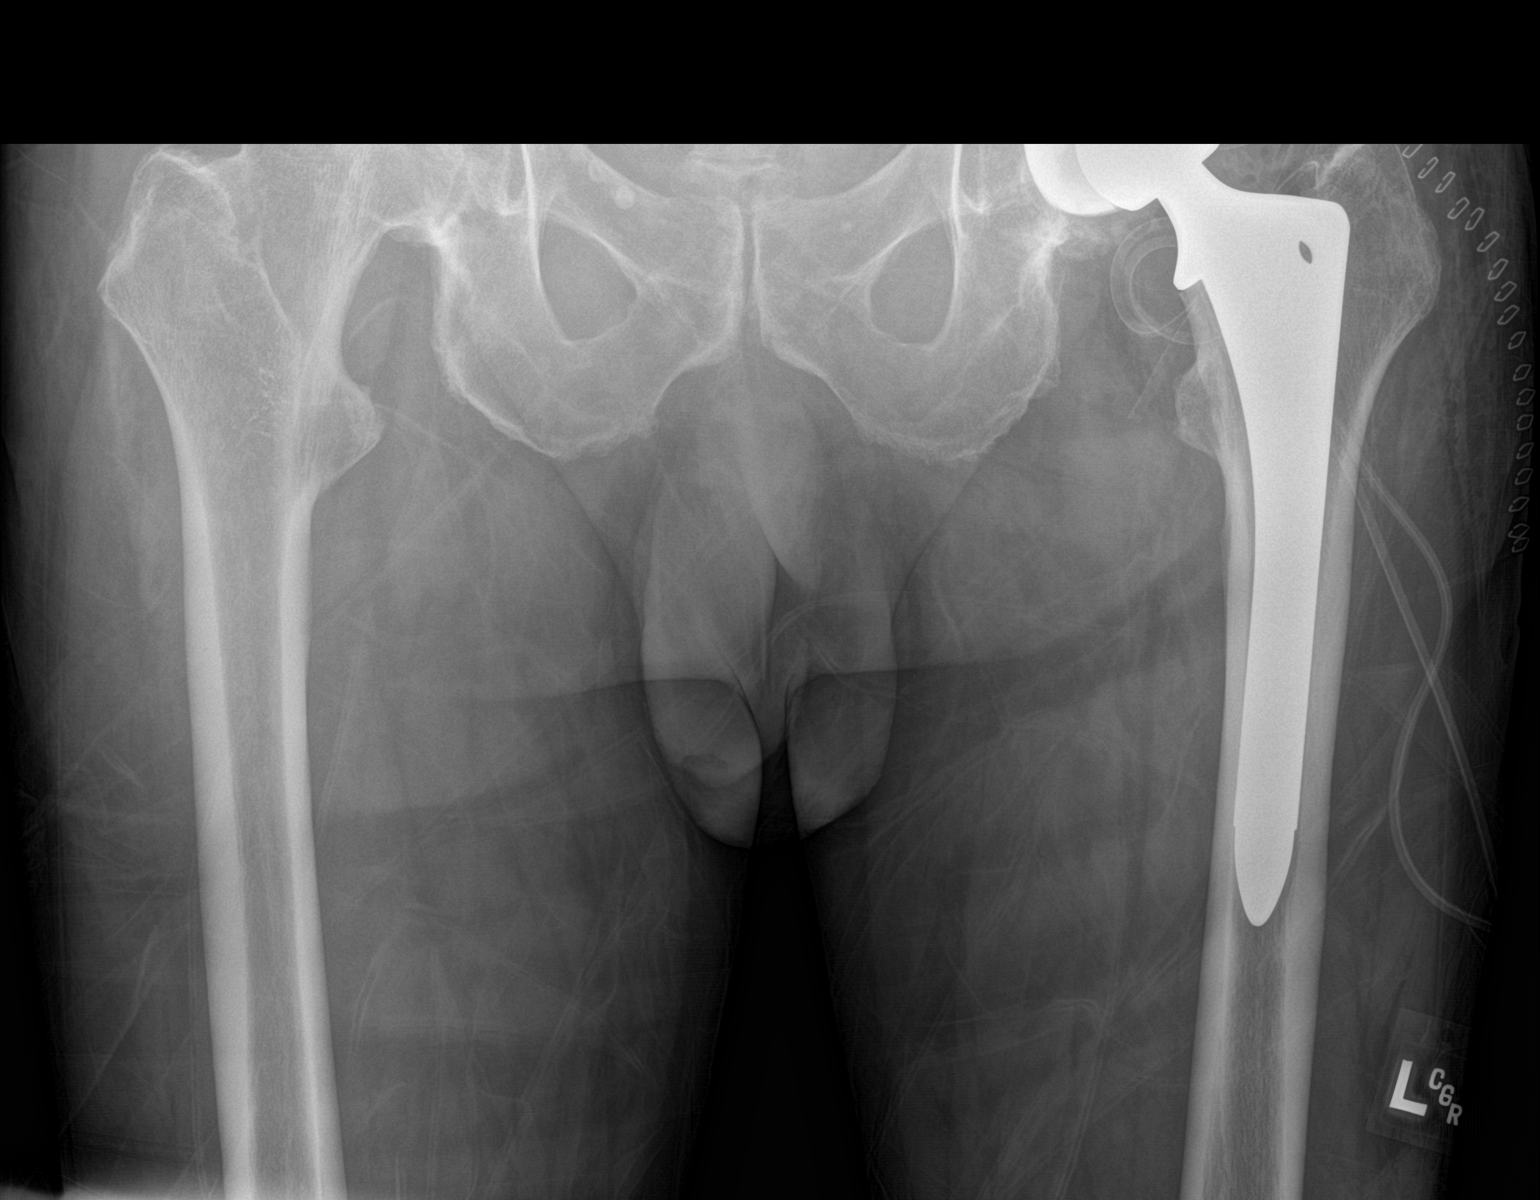

[hip lat]
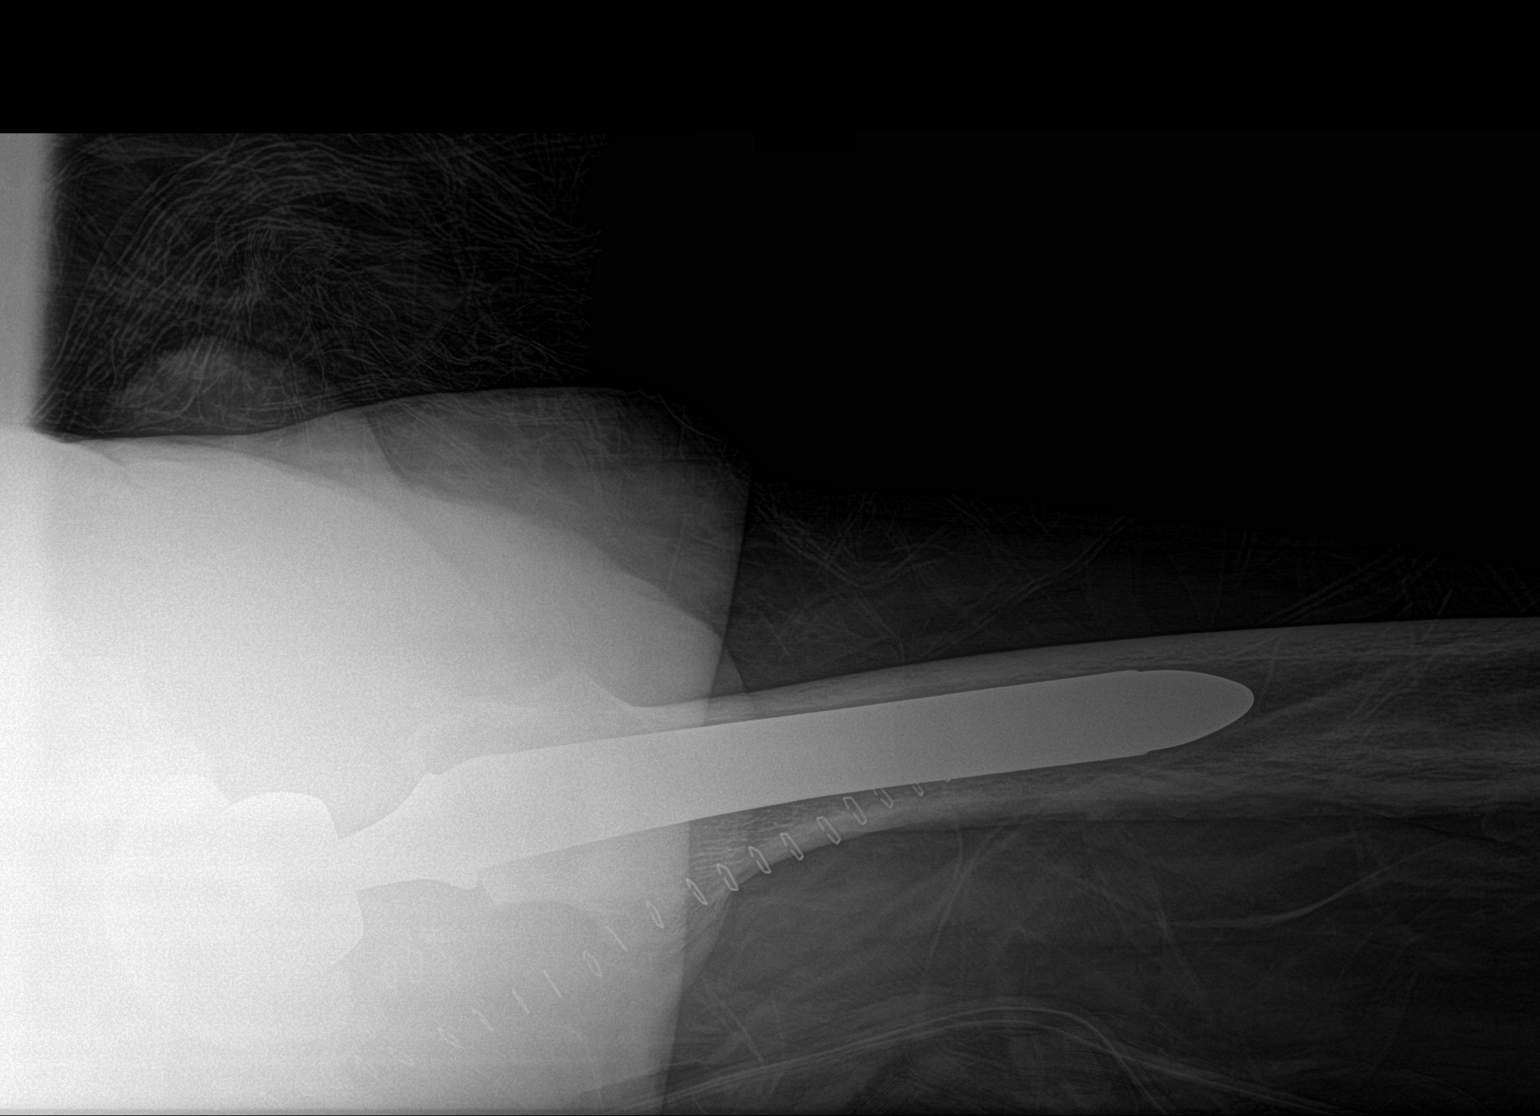

[2 of 2 positions shown; findings below may reference images not displayed]

FINDINGS: Osseous demineralization.

LEFT hip prosthesis with components in expected position.

Overlying surgical drain and skin clips.

No acute fracture, dislocation, or bone destruction.

Narrowing of RIGHT hip joint noted.
IMPRESSION: LEFT hip prosthesis without acute complication.

Degenerative changes RIGHT hip joint.

## 2022-07-07 DIAGNOSIS — M0609 Rheumatoid arthritis without rheumatoid factor, multiple sites: Secondary | ICD-10-CM | POA: Diagnosis not present

## 2022-07-07 DIAGNOSIS — Z79899 Other long term (current) drug therapy: Secondary | ICD-10-CM | POA: Diagnosis not present

## 2022-07-07 DIAGNOSIS — L80 Vitiligo: Secondary | ICD-10-CM | POA: Diagnosis not present

## 2022-07-30 DIAGNOSIS — Z136 Encounter for screening for cardiovascular disorders: Secondary | ICD-10-CM | POA: Diagnosis not present

## 2022-07-30 DIAGNOSIS — Z0000 Encounter for general adult medical examination without abnormal findings: Secondary | ICD-10-CM | POA: Diagnosis not present

## 2022-07-30 DIAGNOSIS — E039 Hypothyroidism, unspecified: Secondary | ICD-10-CM | POA: Diagnosis not present

## 2022-07-30 DIAGNOSIS — D696 Thrombocytopenia, unspecified: Secondary | ICD-10-CM | POA: Diagnosis not present

## 2022-07-30 DIAGNOSIS — I10 Essential (primary) hypertension: Secondary | ICD-10-CM | POA: Diagnosis not present

## 2022-08-04 DIAGNOSIS — E039 Hypothyroidism, unspecified: Secondary | ICD-10-CM | POA: Diagnosis not present

## 2022-08-04 DIAGNOSIS — Z0000 Encounter for general adult medical examination without abnormal findings: Secondary | ICD-10-CM | POA: Diagnosis not present

## 2022-08-04 DIAGNOSIS — D696 Thrombocytopenia, unspecified: Secondary | ICD-10-CM | POA: Diagnosis not present

## 2022-08-04 DIAGNOSIS — I10 Essential (primary) hypertension: Secondary | ICD-10-CM | POA: Diagnosis not present

## 2022-08-11 DIAGNOSIS — Z96642 Presence of left artificial hip joint: Secondary | ICD-10-CM | POA: Diagnosis not present

## 2022-08-27 ENCOUNTER — Ambulatory Visit: Payer: PPO | Admitting: Dermatology

## 2022-09-03 ENCOUNTER — Ambulatory Visit: Payer: PPO | Admitting: Dermatology

## 2022-09-03 DIAGNOSIS — Z79899 Other long term (current) drug therapy: Secondary | ICD-10-CM | POA: Diagnosis not present

## 2022-09-03 DIAGNOSIS — L578 Other skin changes due to chronic exposure to nonionizing radiation: Secondary | ICD-10-CM | POA: Diagnosis not present

## 2022-09-03 DIAGNOSIS — L820 Inflamed seborrheic keratosis: Secondary | ICD-10-CM | POA: Diagnosis not present

## 2022-09-03 DIAGNOSIS — L570 Actinic keratosis: Secondary | ICD-10-CM

## 2022-09-03 DIAGNOSIS — L821 Other seborrheic keratosis: Secondary | ICD-10-CM

## 2022-09-03 DIAGNOSIS — Z5111 Encounter for antineoplastic chemotherapy: Secondary | ICD-10-CM

## 2022-09-03 NOTE — Progress Notes (Signed)
Follow-Up Visit   Subjective  Mitchell Mcdowell is a 71 y.o. male who presents for the following: Actinic Keratosis (Face, hands, 75mf/u, LN2, 5FU/Calcipotriene cr to R cheek ~559mgo with good reaction). The patient has spots, moles and lesions to be evaluated, some may be new or changing and the patient has concerns that these could be cancer.  The following portions of the chart were reviewed this encounter and updated as appropriate:   Tobacco  Allergies  Meds  Problems  Med Hx  Surg Hx  Fam Hx     Review of Systems:  No other skin or systemic complaints except as noted in HPI or Assessment and Plan.  Objective  Well appearing patient in no apparent distress; mood and affect are within normal limits.  A focused examination was performed including face, hands, ears. Relevant physical exam findings are noted in the Assessment and Plan.  face x 15, hands x 8 (23) Pink scaly macules  face x 2, L arm x 1 (3) Stuck on waxy paps with erythema   Assessment & Plan  AK (actinic keratosis) (23) face x 15, hands x 8 Destruction of lesion - face x 15, hands x 8 Complexity: simple   Destruction method: cryotherapy   Informed consent: discussed and consent obtained   Timeout:  patient name, date of birth, surgical site, and procedure verified Lesion destroyed using liquid nitrogen: Yes   Region frozen until ice ball extended beyond lesion: Yes   Outcome: patient tolerated procedure well with no complications   Post-procedure details: wound care instructions given    Related Medications fluorouracil (EFUDEX) 5 % cream Apply topically 2 (two) times daily. Bid to right cheek for 7 days  Inflamed seborrheic keratosis (3) face x 2, L arm x 1 Symptomatic, irritating, patient would like treated. Destruction of lesion - face x 2, L arm x 1 Complexity: simple   Destruction method: cryotherapy   Informed consent: discussed and consent obtained   Timeout:  patient name, date of  birth, surgical site, and procedure verified Lesion destroyed using liquid nitrogen: Yes   Region frozen until ice ball extended beyond lesion: Yes   Outcome: patient tolerated procedure well with no complications   Post-procedure details: wound care instructions given    Actinic Damage - Severe, confluent actinic changes with pre-cancerous actinic keratoses  - Severe, chronic, not at goal, secondary to cumulative UV radiation exposure over time - diffuse scaly erythematous macules and papules with underlying dyspigmentation - Discussed Prescription "Field Treatment" for Severe, Chronic Confluent Actinic Changes with Pre-Cancerous Actinic Keratoses Field treatment involves treatment of an entire area of skin that has confluent Actinic Changes (Sun/ Ultraviolet light damage) and PreCancerous Actinic Keratoses by method of PhotoDynamic Therapy (PDT) and/or prescription Topical Chemotherapy agents such as 5-fluorouracil, 5-fluorouracil/calcipotriene, and/or imiquimod.  The purpose is to decrease the number of clinically evident and subclinical PreCancerous lesions to prevent progression to development of skin cancer by chemically destroying early precancer changes that may or may not be visible.  It has been shown to reduce the risk of developing skin cancer in the treated area. As a result of treatment, redness, scaling, crusting, and open sores may occur during treatment course. One or more than one of these methods may be used and may have to be used several times to control, suppress and eliminate the PreCancerous changes. Discussed treatment course, expected reaction, and possible side effects. - Recommend daily broad spectrum sunscreen SPF 30+ to sun-exposed areas,  reapply every 2 hours as needed.  - Staying in the shade or wearing long sleeves, sun glasses (UVA+UVB protection) and wide brim hats (4-inch brim around the entire circumference of the hat) are also recommended. - Call for new or changing  lesions.  -October 09, 2022 - Start 5-fluorouracil/calcipotriene cream twice a day for 7 days to affected areas including dorsum hands. Patient has prescription at home. Patient advised they will receive an email to purchase the medication online and have it sent to their home. Patient provided with handout reviewing treatment course and side effects and advised to call or message [place] on MyChart with any concerns.  Reviewed course of treatment and expected reaction.  Patient advised to expect inflammation and crusting and advised that erosions are possible.  Patient advised to be diligent with sun protection during and after treatment. Counseled to keep medication out of reach of children and pets.   Seborrheic Keratoses - Stuck-on, waxy, tan-brown papules and/or plaques  - Benign-appearing - Discussed benign etiology and prognosis. - Observe - Call for any changes  Return in about 6 months (around 03/05/2023) for AK f/u.  I, Othelia Pulling, RMA, am acting as scribe for Sarina Ser, MD . Documentation: I have reviewed the above documentation for accuracy and completeness, and I agree with the above.  Sarina Ser, MD

## 2022-09-03 NOTE — Patient Instructions (Addendum)
Cryotherapy Aftercare  Wash gently with soap and water everyday.   Apply Vaseline and Band-Aid daily until healed.   -October 09, 2022 - Start 5-fluorouracil/calcipotriene cream twice a day for 7 days to affected areas including dorsum hands.   5-Fluorouracil/Calcipotriene Patient Education   Actinic keratoses are the dry, red scaly spots on the skin caused by sun damage. A portion of these spots can turn into skin cancer with time, and treating them can help prevent development of skin cancer.   Treatment of these spots requires removal of the defective skin cells. There are various ways to remove actinic keratoses, including freezing with liquid nitrogen, treatment with creams, or treatment with a blue light procedure in the office.   5-fluorouracil cream is a topical cream used to treat actinic keratoses. It works by interfering with the growth of abnormal fast-growing skin cells, such as actinic keratoses. These cells peel off and are replaced by healthy ones.   5-fluorouracil/calcipotriene is a combination of the 5-fluorouracil cream with a vitamin D analog cream called calcipotriene. The calcipotriene alone does not treat actinic keratoses. However, when it is combined with 5-fluorouracil, it helps the 5-fluorouracil treat the actinic keratoses much faster so that the same results can be achieved with a much shorter treatment time.  INSTRUCTIONS FOR 5-FLUOROURACIL/CALCIPOTRIENE CREAM:   5-fluorouracil/calcipotriene cream typically only needs to be used for 4-7 days. A thin layer should be applied twice a day to the treatment areas recommended by your physician.   If your physician prescribed you separate tubes of 5-fluourouracil and calcipotriene, apply a thin layer of 5-fluorouracil followed by a thin layer of calcipotriene.   Avoid contact with your eyes, nostrils, and mouth. Do not use 5-fluorouracil/calcipotriene cream on infected or open wounds.   You will develop redness,  irritation and some crusting at areas where you have pre-cancer damage/actinic keratoses. IF YOU DEVELOP PAIN, BLEEDING, OR SIGNIFICANT CRUSTING, STOP THE TREATMENT EARLY - you have already gotten a good response and the actinic keratoses should clear up well.  Wash your hands after applying 5-fluorouracil 5% cream on your skin.   A moisturizer or sunscreen with a minimum SPF 30 should be applied each morning.   Once you have finished the treatment, you can apply a thin layer of Vaseline twice a day to irritated areas to soothe and calm the areas more quickly. If you experience significant discomfort, contact your physician.  For some patients it is necessary to repeat the treatment for best results.  SIDE EFFECTS: When using 5-fluorouracil/calcipotriene cream, you may have mild irritation, such as redness, dryness, swelling, or a mild burning sensation. This usually resolves within 2 weeks. The more actinic keratoses you have, the more redness and inflammation you can expect during treatment. Eye irritation has been reported rarely. If this occurs, please let us know.  If you have any trouble using this cream, please call the office. If you have any other questions about this information, please do not hesitate to ask me before you leave the office.  Due to recent changes in healthcare laws, you may see results of your pathology and/or laboratory studies on MyChart before the doctors have had a chance to review them. We understand that in some cases there may be results that are confusing or concerning to you. Please understand that not all results are received at the same time and often the doctors may need to interpret multiple results in order to provide you with the best plan of care or course  of treatment. Therefore, we ask that you please give [place] 2 business days to thoroughly review all your results before contacting the office for clarification. Should we see a critical lab result, you will be  contacted sooner.   If You Need Anything After Your Visit  If you have any questions or concerns for your doctor, please call our main line at [phone] and press option 4 to reach your doctor's medical assistant. If no one answers, please leave a voicemail as directed and we will return your call as soon as possible. Messages left after 4 pm will be answered the following business day.   You may also send [place] a message via [place]. We typically respond to MyChart messages within 1-2 business days.  For prescription refills, please ask your pharmacy to contact our office. Our fax number is [phone].  If you have an urgent issue when the clinic is closed that cannot wait until the next business day, you can page your doctor at the number below.    Please note that while we do our best to be available for urgent issues outside of office hours, we are not available 24/7.   If you have an urgent issue and are unable to reach [place], you may choose to seek medical care at your doctor's office, retail clinic, urgent care center, or emergency room.  If you have a medical emergency, please immediately call 911 or go to the emergency department.  Pager Numbers  - Dr. Nehemiah Massed: [phone]  - Dr. Laurence Ferrari: [phone]  - Dr. Nicole Kindred: [phone]  In the event of inclement weather, please call our main line at [phone] for an update on the status of any delays or closures.  Dermatology Medication Tips: Please keep the boxes that topical medications come in in order to help keep track of the instructions about where and how to use these. Pharmacies typically print the medication instructions only on the boxes and not directly on the medication tubes.   If your medication is too expensive, please contact our office at [phone] option 4 or send [place] a message through [place].   We are unable to tell what your co-pay for medications will be in advance as this is different depending on your  insurance coverage. However, we may be able to find a substitute medication at lower cost or fill out paperwork to get insurance to cover a needed medication.   If a prior authorization is required to get your medication covered by your insurance company, please allow [place] 1-2 business days to complete this process.  Drug prices often vary depending on where the prescription is filled and some pharmacies may offer cheaper prices.  The website www.goodrx.com contains coupons for medications through different pharmacies. The prices here do not account for what the cost may be with help from insurance (it may be cheaper with your insurance), but the website can give you the price if you did not use any insurance.  - You can print the associated coupon and take it with your prescription to the pharmacy.  - You may also stop by our office during regular business hours and pick up a GoodRx coupon card.  - If you need your prescription sent electronically to a different pharmacy, notify our office through [facility] or by phone at [phone] option 4.     Si Usted Necesita Algo Despus de Su Visita  Tambin puede enviarnos un mensaje a travs de Pharmacist, community. Por lo general respondemos a los  mensajes de MyChart en el transcurso de 1 a 2 das hbiles.  Para renovar recetas, por favor pida a su farmacia que se ponga en contacto con nuestra oficina. Harland Dingwall de fax es [place] [phone].  Si tiene un asunto urgente cuando la clnica est cerrada y que no puede esperar hasta el siguiente da hbil, puede llamar/localizar a su doctor(a) al nmero que aparece a continuacin.   Por favor, tenga en cuenta que aunque hacemos todo lo posible para estar disponibles para asuntos urgentes fuera del horario de [place], no estamos disponibles las 24 horas del da, los 7 das de la [place].   Si tiene un problema urgente y no puede comunicarse con nosotros, puede optar por buscar atencin mdica  en el  consultorio de su doctor(a), en una clnica privada, en un centro de atencin urgente o en una sala de emergencias.  Si tiene Engineering geologist, por favor llame inmediatamente al 911 o vaya a la sala de emergencias.  Nmeros de bper  - Dr. Nehemiah Massed: [phone]  - Dra. Moye: [phone]  - Dra. Nicole Kindred: [phone]  En caso de inclemencias del [place], por favor llame a Johnsie Kindred principal al [phone] para una actualizacin sobre el [place] de cualquier retraso o cierre.  Consejos para la medicacin en dermatologa: Por favor, guarde las cajas en las que vienen los medicamentos de uso tpico para ayudarle a seguir las instrucciones sobre dnde y cmo usarlos. Las farmacias generalmente imprimen las instrucciones del medicamento slo en las cajas y no directamente en los tubos del [place].   Si su medicamento es muy caro, por favor, pngase en contacto con Zigmund Daniel llamando al [phone] y presione la opcin 4 o envenos un mensaje a travs de Pharmacist, community.   No podemos decirle cul ser su copago por los medicamentos por adelantado ya que esto es diferente dependiendo de la cobertura de su seguro. Sin embargo, es posible que podamos encontrar un medicamento sustituto a Electrical engineer un formulario para que el seguro cubra el medicamento que se considera necesario.   Si se requiere una autorizacin previa para que su compaa de seguros [place] su medicamento, por favor permtanos de 1 a 2 das hbiles para completar este proceso.  Los precios de los medicamentos varan con frecuencia dependiendo del Environmental consultant de dnde se surte la receta y alguna farmacias pueden ofrecer precios ms baratos.  El sitio web www.goodrx.com tiene cupones para medicamentos de Airline pilot. Los precios aqu no tienen en cuenta lo que podra costar con la ayuda del seguro (puede ser ms barato con su seguro), pero el sitio web puede darle el precio si no utiliz Research scientist (physical sciences).  - Puede  imprimir el cupn correspondiente y llevarlo con su receta a la farmacia.  - Tambin puede pasar por nuestra oficina durante el horario de atencin regular y Charity fundraiser una tarjeta de cupones de GoodRx.  - Si necesita que su receta se enve electrnicamente a una farmacia diferente, informe a nuestra oficina a travs de MyChart de [place] o por telfono llamando al [phone] y presione la opcin 4.

## 2022-09-08 ENCOUNTER — Encounter: Payer: Self-pay | Admitting: Dermatology

## 2022-11-17 DIAGNOSIS — M0609 Rheumatoid arthritis without rheumatoid factor, multiple sites: Secondary | ICD-10-CM | POA: Diagnosis not present

## 2022-11-17 DIAGNOSIS — Z79899 Other long term (current) drug therapy: Secondary | ICD-10-CM | POA: Diagnosis not present

## 2022-11-17 DIAGNOSIS — L80 Vitiligo: Secondary | ICD-10-CM | POA: Diagnosis not present

## 2023-01-27 DIAGNOSIS — D696 Thrombocytopenia, unspecified: Secondary | ICD-10-CM | POA: Diagnosis not present

## 2023-01-27 DIAGNOSIS — E039 Hypothyroidism, unspecified: Secondary | ICD-10-CM | POA: Diagnosis not present

## 2023-01-27 DIAGNOSIS — I10 Essential (primary) hypertension: Secondary | ICD-10-CM | POA: Diagnosis not present

## 2023-02-02 DIAGNOSIS — D696 Thrombocytopenia, unspecified: Secondary | ICD-10-CM | POA: Diagnosis not present

## 2023-02-02 DIAGNOSIS — Z136 Encounter for screening for cardiovascular disorders: Secondary | ICD-10-CM | POA: Diagnosis not present

## 2023-02-02 DIAGNOSIS — E039 Hypothyroidism, unspecified: Secondary | ICD-10-CM | POA: Diagnosis not present

## 2023-02-02 DIAGNOSIS — I10 Essential (primary) hypertension: Secondary | ICD-10-CM | POA: Diagnosis not present

## 2023-03-16 ENCOUNTER — Ambulatory Visit: Payer: PPO | Admitting: Dermatology

## 2023-03-16 VITALS — BP 138/86 | HR 79

## 2023-03-16 DIAGNOSIS — Z7189 Other specified counseling: Secondary | ICD-10-CM

## 2023-03-16 DIAGNOSIS — X32XXXA Exposure to sunlight, initial encounter: Secondary | ICD-10-CM

## 2023-03-16 DIAGNOSIS — L820 Inflamed seborrheic keratosis: Secondary | ICD-10-CM

## 2023-03-16 DIAGNOSIS — L821 Other seborrheic keratosis: Secondary | ICD-10-CM | POA: Diagnosis not present

## 2023-03-16 DIAGNOSIS — Z5111 Encounter for antineoplastic chemotherapy: Secondary | ICD-10-CM

## 2023-03-16 DIAGNOSIS — L80 Vitiligo: Secondary | ICD-10-CM | POA: Diagnosis not present

## 2023-03-16 DIAGNOSIS — Z872 Personal history of diseases of the skin and subcutaneous tissue: Secondary | ICD-10-CM

## 2023-03-16 DIAGNOSIS — L570 Actinic keratosis: Secondary | ICD-10-CM

## 2023-03-16 DIAGNOSIS — L578 Other skin changes due to chronic exposure to nonionizing radiation: Secondary | ICD-10-CM | POA: Diagnosis not present

## 2023-03-16 DIAGNOSIS — Z79899 Other long term (current) drug therapy: Secondary | ICD-10-CM

## 2023-03-16 DIAGNOSIS — W908XXA Exposure to other nonionizing radiation, initial encounter: Secondary | ICD-10-CM

## 2023-03-16 NOTE — Progress Notes (Unsigned)
Follow-Up Visit   Subjective  Mitchell Mcdowell is a 72 y.o. male who presents for the following: 6 months f/u hx of precancers on his face and hands, treated his hands with 5FU/Calcipotriene cream ~ 5 months ago The patient has spots, moles and lesions to be evaluated, some may be new or changing and the patient may have concern these could be cancer.  The following portions of the chart were reviewed this encounter and updated as appropriate: medications, allergies, medical history  Review of Systems:  No other skin or systemic complaints except as noted in HPI or Assessment and Plan.  Objective  Well appearing patient in no apparent distress; mood and affect are within normal limits. A focused examination was performed of the following areas: Relevant exam findings are noted in the Assessment and Plan.  right ear antihelix x 1 Stuck-on, waxy, tan-brown papules--Discussed benign etiology and prognosis.   face x 30 (30) Erythematous thin papules/macules with gritty scale.    Assessment & Plan   Inflamed seborrheic keratosis right ear antihelix x 1  Symptomatic, irritating, patient would like treated.   Destruction of lesion - right ear antihelix x 1 Complexity: simple   Destruction method: cryotherapy   Informed consent: discussed and consent obtained   Timeout:  patient name, date of birth, surgical site, and procedure verified Lesion destroyed using liquid nitrogen: Yes   Region frozen until ice ball extended beyond lesion: Yes   Outcome: patient tolerated procedure well with no complications   Post-procedure details: wound care instructions given    AK (actinic keratosis) (30) face x 30  Actinic keratoses are precancerous spots that appear secondary to cumulative UV radiation exposure/sun exposure over time. They are chronic with expected duration over 1 year. A portion of actinic keratoses will progress to squamous cell carcinoma of the skin. It is not possible to  reliably predict which spots will progress to skin cancer and so treatment is recommended to prevent development of skin cancer.  Recommend daily broad spectrum sunscreen SPF 30+ to sun-exposed areas, reapply every 2 hours as needed.  Recommend staying in the shade or wearing long sleeves, sun glasses (UVA+UVB protection) and wide brim hats (4-inch brim around the entire circumference of the hat). Call for new or changing lesions.   Destruction of lesion - face x 30 Complexity: simple   Destruction method: cryotherapy   Informed consent: discussed and consent obtained   Timeout:  patient name, date of birth, surgical site, and procedure verified Lesion destroyed using liquid nitrogen: Yes   Region frozen until ice ball extended beyond lesion: Yes   Outcome: patient tolerated procedure well with no complications   Post-procedure details: wound care instructions given    Related Medications fluorouracil (EFUDEX) 5 % cream Apply topically 2 (two) times daily. Bid to right cheek for 7 days   ACTINIC DAMAGE WITH PRECANCEROUS ACTINIC KERATOSES Counseling for Topical Chemotherapy Management: Patient exhibits: - Severe, confluent actinic changes with pre-cancerous actinic keratoses that is secondary to cumulative UV radiation exposure over time - Condition that is severe; chronic, not at goal. - diffuse scaly erythematous macules and papules with underlying dyspigmentation - Discussed Prescription "Field Treatment" topical Chemotherapy for Severe, Chronic Confluent Actinic Changes with Pre-Cancerous Actinic Keratoses Field treatment involves treatment of an entire area of skin that has confluent Actinic Changes (Sun/ Ultraviolet light damage) and PreCancerous Actinic Keratoses by method of PhotoDynamic Therapy (PDT) and/or prescription Topical Chemotherapy agents such as 5-fluorouracil, 5-fluorouracil/calcipotriene, and/or imiquimod.  The  purpose is to decrease the number of clinically evident  and subclinical PreCancerous lesions to prevent progression to development of skin cancer by chemically destroying early precancer changes that may or may not be visible.  It has been shown to reduce the risk of developing skin cancer in the treated area. As a result of treatment, redness, scaling, crusting, and open sores may occur during treatment course. One or more than one of these methods may be used and may have to be used several times to control, suppress and eliminate the PreCancerous changes. Discussed treatment course, expected reaction, and possible side effects. - Recommend daily broad spectrum sunscreen SPF 30+ to sun-exposed areas, reapply every 2 hours as needed.  - Staying in the shade or wearing long sleeves, sun glasses (UVA+UVB protection) and wide brim hats (4-inch brim around the entire circumference of the hat) are also recommended. - Call for new or changing lesions.  In 1 month restart 5FU/Calcipotriene cream spot treat twice a day for 7-10 days  to any rough areas on the face and hands   SEBORRHEIC KERATOSIS - Stuck-on, waxy, tan-brown papules and/or plaques  - Benign-appearing - Discussed benign etiology and prognosis. - Observe - Call for any changes  Vitiligo hands, face Vitiligo is a chronic autoimmune condition which causes loss of skin pigment and is commonly seen on the face and may also involve areas of trauma like hands, elbows, knees, and ankles. There is no cure and it is difficult to treat.  Treatments include topical steroids and other topical anti-inflammatory ointments/creams and topical and oral Jak inhibitors.  Sometimes narrow band UV light therapy or Xtrac laser is helpful, both of which require twice weekly treatments for at least 3-6 months.  Antioxidant vitamins, such as Vitamins A,C,E,D, Folic Acid and B12 may be added to enhance treatment.   Patient defers treatment  Return in about 6 months (around 09/16/2023) for AKs .  IAngelique Holm, CMA,  am acting as scribe for Armida Sans, MD .   Documentation: I have reviewed the above documentation for accuracy and completeness, and I agree with the above.  Armida Sans, MD

## 2023-03-16 NOTE — Patient Instructions (Addendum)
Cryotherapy Aftercare  Wash gently with soap and water everyday.   Apply Vaseline and Band-Aid daily until healed.     Due to recent changes in healthcare laws, you may see results of your pathology and/or laboratory studies on MyChart before the doctors have had a chance to review them. We understand that in some cases there may be results that are confusing or concerning to you. Please understand that not all results are received at the same time and often the doctors may need to interpret multiple results in order to provide you with the best plan of care or course of treatment. Therefore, we ask that you please give us 2 business days to thoroughly review all your results before contacting the office for clarification. Should we see a critical lab result, you will be contacted sooner.   If You Need Anything After Your Visit  If you have any questions or concerns for your doctor, please call our main line at [phone] and press option 4 to reach your doctor's medical assistant. If no one answers, please leave a voicemail as directed and we will return your call as soon as possible. Messages left after 4 pm will be answered the following business day.   You may also send us a message via MyChart. We typically respond to MyChart messages within 1-2 business days.  For prescription refills, please ask your pharmacy to contact our office. Our fax number is [phone].  If you have an urgent issue when the clinic is closed that cannot wait until the next business day, you can page your doctor at the number below.    Please note that while we do our best to be available for urgent issues outside of office hours, we are not available 24/7.   If you have an urgent issue and are unable to reach us, you may choose to seek medical care at your doctor's office, retail clinic, urgent care center, or emergency room.  If you have a medical emergency, please immediately call 911 or go to the  emergency department.  Pager Numbers  - Dr. Kowalski: [phone]  - Dr. Moye: [phone]  - Dr. Stewart: [phone]  In the event of inclement weather, please call our main line at [phone] for an update on the status of any delays or closures.  Dermatology Medication Tips: Please keep the boxes that topical medications come in in order to help keep track of the instructions about where and how to use these. Pharmacies typically print the medication instructions only on the boxes and not directly on the medication tubes.   If your medication is too expensive, please contact our office at [phone] option 4 or send us a message through MyChart.   We are unable to tell what your co-pay for medications will be in advance as this is different depending on your insurance coverage. However, we may be able to find a substitute medication at lower cost or fill out paperwork to get insurance to cover a needed medication.   If a prior authorization is required to get your medication covered by your insurance company, please allow us 1-2 business days to complete this process.  Drug prices often vary depending on where the prescription is filled and some pharmacies may offer cheaper prices.  The website www.goodrx.com contains coupons for medications through different pharmacies. The prices here do not account for what the cost may be with help from insurance (it may be cheaper with your insurance), but the website can   give you the price if you did not use any insurance.  - You can print the associated coupon and take it with your prescription to the pharmacy.  - You may also stop by our office during regular business hours and pick up a GoodRx coupon card.  - If you need your prescription sent electronically to a different pharmacy, notify our office through  MyChart or by phone at [phone] option 4.     Si Usted Necesita Algo Despus de Su Visita  Tambin puede  enviarnos un mensaje a travs de MyChart. Por lo general respondemos a los mensajes de MyChart en el transcurso de 1 a 2 das hbiles.  Para renovar recetas, por favor pida a su farmacia que se ponga en contacto con nuestra oficina. Nuestro nmero de fax es el [phone].  Si tiene un asunto urgente cuando la clnica est cerrada y que no puede esperar hasta el siguiente da hbil, puede llamar/localizar a su doctor(a) al nmero que aparece a continuacin.   Por favor, tenga en cuenta que aunque hacemos todo lo posible para estar disponibles para asuntos urgentes fuera del horario de oficina, no estamos disponibles las 24 horas del da, los 7 das de la semana.   Si tiene un problema urgente y no puede comunicarse con nosotros, puede optar por buscar atencin mdica  en el consultorio de su doctor(a), en una clnica privada, en un centro de atencin urgente o en una sala de emergencias.  Si tiene una emergencia mdica, por favor llame inmediatamente al 911 o vaya a la sala de emergencias.  Nmeros de bper  - Dr. Kowalski: [phone]  - Dra. Moye: [phone]  - Dra. Stewart: [phone]  En caso de inclemencias del tiempo, por favor llame a nuestra lnea principal al [phone] para una actualizacin sobre el estado de cualquier retraso o cierre.  Consejos para la medicacin en dermatologa: Por favor, guarde las cajas en las que vienen los medicamentos de uso tpico para ayudarle a seguir las instrucciones sobre dnde y cmo usarlos. Las farmacias generalmente imprimen las instrucciones del medicamento slo en las cajas y no directamente en los tubos del medicamento.   Si su medicamento es muy caro, por favor, pngase en contacto con nuestra oficina llamando al [phone] y presione la opcin 4 o envenos un mensaje a travs de MyChart.   No podemos decirle cul ser su copago por los medicamentos por adelantado ya que esto es diferente dependiendo de la cobertura de su seguro.  Sin embargo, es posible que podamos encontrar un medicamento sustituto a menor costo o llenar un formulario para que el seguro cubra el medicamento que se considera necesario.   Si se requiere una autorizacin previa para que su compaa de seguros cubra su medicamento, por favor permtanos de 1 a 2 das hbiles para completar este proceso.  Los precios de los medicamentos varan con frecuencia dependiendo del lugar de dnde se surte la receta y alguna farmacias pueden ofrecer precios ms baratos.  El sitio web www.goodrx.com tiene cupones para medicamentos de diferentes farmacias. Los precios aqu no tienen en cuenta lo que podra costar con la ayuda del seguro (puede ser ms barato con su seguro), pero el sitio web puede darle el precio si no utiliz ningn seguro.  - Puede imprimir el cupn correspondiente y llevarlo con su receta a la farmacia.  - Tambin puede pasar por nuestra oficina durante el horario de atencin regular y recoger una tarjeta de cupones de GoodRx.  -   Si necesita que su receta se enve electrnicamente a una farmacia diferente, informe a nuestra oficina a travs de MyChart de [place] o por telfono llamando al [phone] y presione la opcin 4.  

## 2023-03-18 ENCOUNTER — Encounter: Payer: Self-pay | Admitting: Dermatology

## 2023-03-18 DIAGNOSIS — Z79899 Other long term (current) drug therapy: Secondary | ICD-10-CM | POA: Diagnosis not present

## 2023-03-18 DIAGNOSIS — M0609 Rheumatoid arthritis without rheumatoid factor, multiple sites: Secondary | ICD-10-CM | POA: Diagnosis not present

## 2023-07-08 ENCOUNTER — Other Ambulatory Visit: Payer: Self-pay

## 2023-07-08 ENCOUNTER — Emergency Department: Payer: PPO

## 2023-07-08 ENCOUNTER — Emergency Department
Admission: EM | Admit: 2023-07-08 | Discharge: 2023-07-08 | Disposition: A | Discharge status: Home / Self Care | Payer: PPO | Attending: Emergency Medicine | Admitting: Emergency Medicine

## 2023-07-08 DIAGNOSIS — I10 Essential (primary) hypertension: Secondary | ICD-10-CM | POA: Diagnosis not present

## 2023-07-08 DIAGNOSIS — Z23 Encounter for immunization: Secondary | ICD-10-CM | POA: Diagnosis not present

## 2023-07-08 DIAGNOSIS — E039 Hypothyroidism, unspecified: Secondary | ICD-10-CM | POA: Insufficient documentation

## 2023-07-08 DIAGNOSIS — W0110XA Fall on same level from slipping, tripping and stumbling with subsequent striking against unspecified object, initial encounter: Secondary | ICD-10-CM | POA: Insufficient documentation

## 2023-07-08 DIAGNOSIS — W19XXXA Unspecified fall, initial encounter: Secondary | ICD-10-CM

## 2023-07-08 DIAGNOSIS — Y92015 Private garage of single-family (private) house as the place of occurrence of the external cause: Secondary | ICD-10-CM | POA: Diagnosis not present

## 2023-07-08 DIAGNOSIS — Z043 Encounter for examination and observation following other accident: Secondary | ICD-10-CM | POA: Diagnosis not present

## 2023-07-08 DIAGNOSIS — M5030 Other cervical disc degeneration, unspecified cervical region: Secondary | ICD-10-CM | POA: Diagnosis not present

## 2023-07-08 DIAGNOSIS — S0101XA Laceration without foreign body of scalp, initial encounter: Secondary | ICD-10-CM | POA: Insufficient documentation

## 2023-07-08 MED ORDER — TETANUS-DIPHTH-ACELL PERTUSSIS 5-2.5-18.5 LF-MCG/0.5 IM SUSY
0.5000 mL | PREFILLED_SYRINGE | Freq: Once | INTRAMUSCULAR | Status: AC
  Administered 2023-07-08: 0.5 mL via INTRAMUSCULAR
  Filled 2023-07-08: qty 0.5

## 2023-07-08 MED ORDER — LIDOCAINE-EPINEPHRINE 2 %-1:100000 IJ SOLN
20.0000 mL | Freq: Once | INTRAMUSCULAR | Status: AC
Start: 2023-07-08 — End: 2023-07-08
  Administered 2023-07-08: 20 mL
  Filled 2023-07-08: qty 1

## 2023-07-08 NOTE — ED Notes (Signed)
See triage note  Presents s/p fall States he fell from ladder when is was coming down steps  Landed on buttocks  Having some pain to lower back and bilateral hip area  Laceration noted to head  No LOC

## 2023-07-08 NOTE — ED Triage Notes (Addendum)
Pt comes with c/o fall. Pt states he was in garage and was getting stuff down from the attic stairs. Pt stats he fell backward about 4 feet. Pt denies any loc or thinners.  Pt has laceration to top of head. Swelling noted bleeding controlled at this time. Pt states lower back and some hip pain. Pt states he banged elbow also.   Pt ambulatory with steady gait.

## 2023-07-08 NOTE — ED Provider Notes (Signed)
[facility] Provider Note    Event Date/Time   First MD Initiated Contact with Patient 07/08/23 1104     (approximate)   History   Chief Complaint Fall   HPI  CAGE HOLLEMAN is a 72 y.o. male with past medical history of hypertension and hypothyroidism who presents to the ED following fall.  Patient reports that he was in tempting to go up some stairs into the attic above his garage when he lost his balance and fell backwards.  He states he struck his head on concrete but did not have any loss of consciousness.  He now complains of some mild headache and neck stiffness, also reports some discomfort in both hips but has been able to walk without difficulty.  He does not take any blood thinners.     Physical Exam   Triage Vital Signs: ED Triage Vitals  Encounter Vitals Group     BP 07/08/23 1005 (!) 159/90     Systolic BP Percentile --      Diastolic BP Percentile --      Pulse Rate 07/08/23 1005 63     Resp 07/08/23 1005 18     Temp 07/08/23 1005 98.2 F (36.8 C)     Temp src --      SpO2 07/08/23 1005 95 %     Weight 07/08/23 1006 186 lb 8.2 oz (84.6 kg)     Height 07/08/23 1006 6\' 1"  (1.854 m)     Head Circumference --      Peak Flow --      Pain Score 07/08/23 1003 7     Pain Loc --      Pain Education --      Exclude from Growth Chart --     Most recent vital signs: Vitals:   07/08/23 1005  BP: (!) 159/90  Pulse: 63  Resp: 18  Temp: 98.2 F (36.8 C)  SpO2: 95%    Constitutional: Alert and oriented. Eyes: Conjunctivae are normal. Head: Occipital scalp laceration noted with no hematoma or step-off. Nose: No congestion/rhinnorhea. Mouth/Throat: Mucous membranes are moist.  Neck: No midline cervical spine tenderness to palpation. Cardiovascular: Normal rate, regular rhythm. Grossly normal heart sounds.  2+ radial pulses bilaterally. Respiratory: Normal respiratory effort.  No retractions. Lungs CTAB. Gastrointestinal: Soft and  nontender. No distention. Musculoskeletal: No lower extremity tenderness nor edema.  Neurologic:  Normal speech and language. No gross focal neurologic deficits are appreciated.    ED Results / Procedures / Treatments   Labs (all labs ordered are listed, but only abnormal results are displayed) Labs Reviewed - No data to display  RADIOLOGY CT head reviewed and interpreted by me with no hemorrhage or midline shift.  PROCEDURES:  Critical Care performed: No  ..Laceration Repair  Date/Time: 07/08/2023 12:18 PM  Performed by: Chesley Noon, MD Authorized by: Chesley Noon, MD   Consent:    Consent obtained:  Verbal   Consent given by:  Patient Universal protocol:    Patient identity confirmed:  Arm band and verbally with patient Anesthesia:    Anesthesia method:  Local infiltration   Local anesthetic:  Lidocaine 2% WITH epi Laceration details:    Location:  Scalp   Scalp location:  Occipital   Length (cm):  3 Pre-procedure details:    Preparation:  Patient was prepped and draped in usual sterile fashion and imaging obtained to evaluate for foreign bodies Exploration:    Limited defect created (wound extended): no  Hemostasis achieved with:  Epinephrine and direct pressure   Imaging obtained comment:  CT   Imaging outcome: foreign body not noted     Wound exploration: wound explored through full range of motion and entire depth of wound visualized     Wound extent: areolar tissue not violated, fascia not violated, no foreign body, no signs of injury, no nerve damage, no tendon damage, no underlying fracture and no vascular damage     Contaminated: no   Treatment:    Area cleansed with:  Saline   Amount of cleaning:  Standard   Irrigation solution:  Sterile saline   Irrigation method:  Pressure wash   Debridement:  None   Undermining:  None   Scar revision: no   Skin repair:    Repair method:  Staples   Number of staples:  3 Approximation:    Approximation:   Loose Repair type:    Repair type:  Simple Post-procedure details:    Dressing:  Open (no dressing)   Procedure completion:  Tolerated well, no immediate complications    MEDICATIONS ORDERED IN ED: Medications  lidocaine-EPINEPHrine (XYLOCAINE W/EPI) 2 %-1:100000 (with pres) injection 20 mL (has no administration in time range)  Tdap (BOOSTRIX) injection 0.5 mL (0.5 mLs Intramuscular Given 07/08/23 1157)     IMPRESSION / MDM / ASSESSMENT AND PLAN / ED COURSE  I reviewed the triage vital signs and the nursing notes.                              72 y.o. male with past medical history of hypertension and hypothyroidism who presents to the ED complaining of fall striking the back of his head just prior to arrival.  Patient's presentation is most consistent with acute complicated illness / injury requiring diagnostic workup.  Differential diagnosis includes, but is not limited to, intracranial injury, scalp laceration, cervical spine injury.  Patient nontoxic-appearing and in no acute distress, vital signs are unremarkable.  CT imaging of head and C-spine are negative for acute process, patient does have a laceration to his occipital scalp that was repaired with staples.  His tetanus was updated, no evidence of injury to his trunk or extremities.  Patient reports some hip discomfort but has been able to ambulate without pain and I doubt fracture or dislocation.  He is appropriate for outpatient management with staple removal in 1 week, was counseled to return to the ED for new or worsening symptoms.  Patient agrees with plan.      FINAL CLINICAL IMPRESSION(S) / ED DIAGNOSES   Final diagnoses:  Fall, initial encounter  Laceration of scalp, initial encounter     Rx / DC Orders   ED Discharge Orders     None        Note:  This document was prepared using Dragon voice recognition software and may include unintentional dictation errors.   Chesley Noon, MD 07/08/23  [phone]

## 2023-07-08 NOTE — Progress Notes (Signed)
07/07/1952: Discharge from ED 1154 pm. [facility] ED [facility] Liaison for [facility] evaluated/screened patient chart for potential or prior engagements pending disposition (discharged).   Gabriel Cirri RN MSN    [facility] Population [facility] Title: ED RN Liaison Email: [email or web address] Direct Dial: Teams chat or secure chat                        Website: [email or web address]    Note: [facility] ED liaison does not counter or interfere with Inpatient Transitions of Care discharge planning or disposition.

## 2023-07-17 ENCOUNTER — Ambulatory Visit
Admission: EM | Admit: 2023-07-17 | Discharge: 2023-07-17 | Disposition: A | Discharge status: Home / Self Care | Payer: PPO

## 2023-07-17 ENCOUNTER — Encounter: Payer: Self-pay | Admitting: *Deleted

## 2023-07-17 DIAGNOSIS — Z4802 Encounter for removal of sutures: Secondary | ICD-10-CM | POA: Diagnosis not present

## 2023-07-17 NOTE — Discharge Instructions (Addendum)
Please return if you develop any redness, drainage, or fever.

## 2023-07-17 NOTE — ED Provider Notes (Signed)
[facility]    CSN: [id number] Arrival date & time: 07/17/23  1333      History   Chief Complaint Chief Complaint  Patient presents with   Suture / Staple Removal         HPI Mitchell Mcdowell is a 72 y.o. male.   HPI  72 year old male with past medical history significant for hypertension, hypothyroidism, and vitiligo presents for staple removal.  He was seen in the emergency department at [facility] on 07/08/2023 and received 3 staples to the crown as a result of a laceration sustained in a fall.  He reports that he has not had any fever or drainage from the area.  Past Medical History:  Diagnosis Date   Arthritis    Hypertension    Hypothyroidism    Impotence    Vitiligo     Patient Active Problem List   Diagnosis Date Noted   Hx of total hip arthroplasty, left 09/10/2021   Thrombocytopenia (HCC) 07/31/2021   Benign essential HTN 08/13/2014   Hypothyroidism (acquired) 08/13/2014   Colon polyp 03/20/2014   Impotence 03/20/2014   Inflammatory arthritis 03/20/2014   Osteoarthritis 03/20/2014   Vitiligo 03/20/2014    Past Surgical History:  Procedure Laterality Date   ARTHROSCOPIC REPAIR ACL Right 35 years ago   CATARACT EXTRACTION W/PHACO Right 10/02/2019   Procedure: CATARACT EXTRACTION PHACO AND INTRAOCULAR LENS PLACEMENT (IOC) RIGHT 2.05,   00:26.0;  Surgeon: Nevada Crane, MD;  Location: [facility];  Service: Ophthalmology;  Laterality: Right;   COLONOSCOPY WITH PROPOFOL N/A 01/20/2016   Procedure: COLONOSCOPY WITH PROPOFOL;  Surgeon: Wallace Cullens, MD;  Location: [facility] ENDOSCOPY;  Service: Gastroenterology;  Laterality: N/A;   COLONOSCOPY WITH PROPOFOL N/A 03/20/2022   Procedure: COLONOSCOPY WITH PROPOFOL;  Surgeon: Regis Bill, MD;  Location: [facility] ENDOSCOPY;  Service: Endoscopy;  Laterality: N/A;   ESOPHAGOGASTRODUODENOSCOPY (EGD) WITH PROPOFOL N/A 01/20/2016   Procedure: ESOPHAGOGASTRODUODENOSCOPY (EGD) WITH PROPOFOL;  Surgeon: Wallace Cullens, MD;  Location: [facility] ENDOSCOPY;  Service: Gastroenterology;  Laterality: N/A;   THYROID CYST EXCISION  2000   TOTAL HIP ARTHROPLASTY Left 09/10/2021   Procedure: TOTAL HIP ARTHROPLASTY;  Surgeon: Donato Heinz, MD;  Location: [facility] ORS;  Service: Orthopedics;  Laterality: Left;       Home Medications    Prior to Admission medications   Medication Sig Start Date End Date Taking? Authorizing Provider  celecoxib (CELEBREX) 200 MG capsule Take 1 capsule (200 mg total) by mouth 2 (two) times daily. 09/11/21   Lasandra Beech B, PA-C  enoxaparin (LOVENOX) 40 MG/0.4ML injection Inject 0.4 mLs (40 mg total) into the skin daily for 14 days. 09/11/21 09/25/21  Lasandra Beech B, PA-C  ferrous sulfate 325 (65 FE) MG tablet Take 325 mg by mouth 2 (two) times daily with a meal.    [provider]  fluorouracil (EFUDEX) 5 % cream Apply topically 2 (two) times daily. Bid to right cheek for 7 days 02/12/22   Deirdre Evener, MD  fluticasone [facility]) 50 MCG/ACT nasal spray Place 2 sprays into both nostrils daily.    [provider]  folic acid (FOLVITE) 1 MG tablet Take 1 mg by mouth daily.    [provider]  hydrochlorothiazide (MICROZIDE) 12.5 MG capsule Take 12.5 mg by mouth daily.    [provider]  levothyroxine (SYNTHROID, LEVOTHROID) 175 MCG tablet Take 175 mcg by mouth daily before breakfast.    [provider]  loratadine (CLARITIN) 10  MG tablet Take 10 mg by mouth daily.    [provider]  methotrexate (RHEUMATREX) 2.5 MG tablet Take 10 mg by mouth every Wednesday. Caution:Chemotherapy. Protect from light.    [provider]  oxyCODONE (OXY IR/ROXICODONE) 5 MG immediate release tablet Take 1 tablet (5 mg total) by mouth every 4 (four) hours as needed for severe pain (pain score 4-6). 09/11/21   Madelyn Flavors, PA-C  sildenafil (VIAGRA) 50 MG tablet Take 50 mg by mouth daily as needed for erectile dysfunction.    [provider]  tetrahydrozoline 0.05 % ophthalmic solution Place 1 drop into both eyes daily as needed (dry/irritated eyes).    [provider]  Tetrahydrozoline-Zn Sulfate (RA RELIEF STERILE EYE DROPS OP) Apply to eye.    [provider]  traMADol (ULTRAM) 50 MG tablet Take 1 tablet (50 mg total) by mouth every 4 (four) hours as needed for moderate pain. Patient not taking: Reported on 03/20/2022 09/11/21   Myrtis Ser    Family History History reviewed. No pertinent family history.  Social History Social History   Tobacco Use   Smoking status: Never   Smokeless tobacco: Never  Vaping Use   Vaping status: Never Used  Substance Use Topics   Alcohol use: Not Currently    Comment: since 08/09/21   Drug use: No     Allergies   Patient has no known allergies.   Review of Systems Review of Systems  Skin:  Positive for wound. Negative for color change.     Physical Exam Triage Vital Signs ED Triage Vitals  Encounter Vitals Group     BP      Systolic BP Percentile      Diastolic BP Percentile      Pulse      Resp      Temp      Temp src      SpO2      Weight      Height      Head Circumference      Peak Flow      Pain Score      Pain Loc      Pain Education      Exclude from Growth Chart    No data found.  Updated Vital Signs BP (!) 159/86 (BP Location: Left Arm)   Pulse 61   Temp 98.2 F (36.8 C) (Oral)   Ht 6\' 1"  (1.854 m)   Wt 185 lb (83.9 kg)   SpO2 96%   BMI 24.41 kg/m   Visual Acuity Right Eye Distance:   Left Eye Distance:   Bilateral Distance:    Right Eye Near:   Left Eye Near:    Bilateral Near:     Physical Exam Vitals and nursing note reviewed.  Constitutional:      Appearance: Normal appearance. He is not ill-appearing.  HENT:     Head: Normocephalic and atraumatic.  Skin:    General: Skin is warm and dry.     Capillary Refill: Capillary refill takes less than 2 seconds.     Findings: No bruising  or erythema.  Neurological:     General: No focal deficit present.     Mental Status: He is alert and oriented to person, place, and time.      UC Treatments / Results  Labs (all labs ordered are listed, but only abnormal results are displayed) Labs Reviewed - No data to display  EKG  Radiology No results found.  Procedures Procedures (including critical care time)  Medications Ordered in UC Medications - No data to display  Initial Impression / Assessment and Plan / UC Course  I have reviewed the triage vital signs and the nursing notes.  Pertinent labs & imaging results that were available during my care of the patient were reviewed by me and considered in my medical decision making (see chart for details).   Patient is a pleasant, nontoxic-appearing 72 year old male presenting for evaluation for staple removal from the chronic his head.  As you can see in image above, the wound is well-healed and is free of erythema or drainage.  The staples are slightly bent in the patient reports that his daughter attempted to remove them last night but did not have the right total.  With minimal difficulty, after cleaning the staples with alcohol, they were successfully removed with a staple remover.  The you wound remains closed and there is no bleeding.  Final Clinical Impressions(s) / UC Diagnoses   Final diagnoses:  Encounter for staple removal     Discharge Instructions      Please return if you develop any redness, drainage, or fever.     ED Prescriptions   None    PDMP not reviewed this encounter.   Becky Augusta, NP 07/17/23 1408

## 2023-07-17 NOTE — ED Triage Notes (Signed)
Patient in for staple removal, placed at [facility] on 8/29.  No pain or other complaints

## 2023-07-20 DIAGNOSIS — M0609 Rheumatoid arthritis without rheumatoid factor, multiple sites: Secondary | ICD-10-CM | POA: Diagnosis not present

## 2023-07-20 DIAGNOSIS — Z136 Encounter for screening for cardiovascular disorders: Secondary | ICD-10-CM | POA: Diagnosis not present

## 2023-07-20 DIAGNOSIS — I10 Essential (primary) hypertension: Secondary | ICD-10-CM | POA: Diagnosis not present

## 2023-07-20 DIAGNOSIS — E039 Hypothyroidism, unspecified: Secondary | ICD-10-CM | POA: Diagnosis not present

## 2023-07-20 DIAGNOSIS — Z79899 Other long term (current) drug therapy: Secondary | ICD-10-CM | POA: Diagnosis not present

## 2023-08-12 DIAGNOSIS — Z96642 Presence of left artificial hip joint: Secondary | ICD-10-CM | POA: Diagnosis not present

## 2023-08-19 DIAGNOSIS — Z0000 Encounter for general adult medical examination without abnormal findings: Secondary | ICD-10-CM | POA: Diagnosis not present

## 2023-08-19 DIAGNOSIS — E039 Hypothyroidism, unspecified: Secondary | ICD-10-CM | POA: Diagnosis not present

## 2023-08-19 DIAGNOSIS — I10 Essential (primary) hypertension: Secondary | ICD-10-CM | POA: Diagnosis not present

## 2023-09-16 ENCOUNTER — Ambulatory Visit: Payer: PPO | Admitting: Dermatology

## 2023-09-16 ENCOUNTER — Encounter: Payer: Self-pay | Admitting: Dermatology

## 2023-09-16 DIAGNOSIS — Z7189 Other specified counseling: Secondary | ICD-10-CM

## 2023-09-16 DIAGNOSIS — L578 Other skin changes due to chronic exposure to nonionizing radiation: Secondary | ICD-10-CM

## 2023-09-16 DIAGNOSIS — L570 Actinic keratosis: Secondary | ICD-10-CM

## 2023-09-16 DIAGNOSIS — W908XXA Exposure to other nonionizing radiation, initial encounter: Secondary | ICD-10-CM

## 2023-09-16 DIAGNOSIS — L80 Vitiligo: Secondary | ICD-10-CM | POA: Diagnosis not present

## 2023-09-16 NOTE — Patient Instructions (Signed)

## 2023-09-16 NOTE — Progress Notes (Signed)
Follow-Up Visit   Subjective  Mitchell Mcdowell is a 72 y.o. male who presents for the following: 6 month AK follow up. Tx with LN2 03/16/2023. Face. Used 5FU/Calcipotriene as directed since last visit.   The patient has spots, moles and lesions to be evaluated, some may be new or changing and the patient may have concern these could be cancer.   The following portions of the chart were reviewed this encounter and updated as appropriate: medications, allergies, medical history  Review of Systems:  No other skin or systemic complaints except as noted in HPI or Assessment and Plan.  Objective  Well appearing patient in no apparent distress; mood and affect are within normal limits.  A focused examination was performed of the following areas: Face, scalp, ears, neck, arms, hands   Relevant physical exam findings are noted in the Assessment and Plan.  hands, arms, face x17 (17) Erythematous thin papules/macules with gritty scale.     Assessment & Plan   AK (actinic keratosis) (17) hands, arms, face x17  Actinic keratoses are precancerous spots that appear secondary to cumulative UV radiation exposure/sun exposure over time. They are chronic with expected duration over 1 year. A portion of actinic keratoses will progress to squamous cell carcinoma of the skin. It is not possible to reliably predict which spots will progress to skin cancer and so treatment is recommended to prevent development of skin cancer.  Recommend daily broad spectrum sunscreen SPF 30+ to sun-exposed areas, reapply every 2 hours as needed.  Recommend staying in the shade or wearing long sleeves, sun glasses (UVA+UVB protection) and wide brim hats (4-inch brim around the entire circumference of the hat). Call for new or changing lesions.  Destruction of lesion - hands, arms, face x17 (17) Complexity: simple   Destruction method: cryotherapy   Informed consent: discussed and consent obtained   Timeout:  patient  name, date of birth, surgical site, and procedure verified Lesion destroyed using liquid nitrogen: Yes   Region frozen until ice ball extended beyond lesion: Yes   Outcome: patient tolerated procedure well with no complications   Post-procedure details: wound care instructions given   Additional details:  Prior to procedure, discussed risks of blister formation, small wound, skin dyspigmentation, or rare scar following cryotherapy. Recommend Vaseline ointment to treated areas while healing.   Related Medications fluorouracil (EFUDEX) 5 % cream Apply topically 2 (two) times daily. Bid to right cheek for 7 days   VITILIGO Exam: depigmented patches on hands, arms, face  Vitiligo is a chronic autoimmune condition which causes loss of skin pigment and is commonly seen on the face and may also involve areas of trauma like hands, elbows, knees, and ankles. There is no cure and it is difficult to treat.  Treatments include topical steroids and other topical anti-inflammatory ointments/creams and topical and oral Jak inhibitors.  Sometimes narrow band UV light therapy or Xtrac laser is helpful, both of which require twice weekly treatments for at least 3-6 months.  Antioxidant vitamins, such as Vitamins A,C,E,D, Folic Acid and B12 may be added to enhance treatment. Heliocare may also enhance treatment results.  Treatment Plan: Patient deferred treatment at this time.  ACTINIC DAMAGE - chronic, secondary to cumulative UV radiation exposure/sun exposure over time - diffuse scaly erythematous macules with underlying dyspigmentation - Recommend daily broad spectrum sunscreen SPF 30+ to sun-exposed areas, reapply every 2 hours as needed.  - Recommend staying in the shade or wearing long sleeves, sun glasses (UVA+UVB  protection) and wide brim hats (4-inch brim around the entire circumference of the hat). - Call for new or changing lesions.    Return in about 6 months (around 03/15/2024) for AK Follow  Up.  I, Lawson Radar, CMA, am acting as scribe for Armida Sans, MD.   Documentation: I have reviewed the above documentation for accuracy and completeness, and I agree with the above.  Armida Sans, MD

## 2023-11-23 DIAGNOSIS — M0609 Rheumatoid arthritis without rheumatoid factor, multiple sites: Secondary | ICD-10-CM | POA: Diagnosis not present

## 2023-11-23 DIAGNOSIS — Z79899 Other long term (current) drug therapy: Secondary | ICD-10-CM | POA: Diagnosis not present

## 2024-02-15 ENCOUNTER — Encounter: Payer: Self-pay | Admitting: Dermatology

## 2024-02-15 DIAGNOSIS — L570 Actinic keratosis: Secondary | ICD-10-CM

## 2024-02-16 MED ORDER — FLUOROURACIL 5 % EX CREA
TOPICAL_CREAM | Freq: Two times a day (BID) | CUTANEOUS | 1 refills | Status: AC
Start: 2024-02-16 — End: ?

## 2024-03-07 DIAGNOSIS — I10 Essential (primary) hypertension: Secondary | ICD-10-CM | POA: Diagnosis not present

## 2024-03-07 DIAGNOSIS — E039 Hypothyroidism, unspecified: Secondary | ICD-10-CM | POA: Diagnosis not present

## 2024-03-14 DIAGNOSIS — Z136 Encounter for screening for cardiovascular disorders: Secondary | ICD-10-CM | POA: Diagnosis not present

## 2024-03-14 DIAGNOSIS — N529 Male erectile dysfunction, unspecified: Secondary | ICD-10-CM | POA: Diagnosis not present

## 2024-03-14 DIAGNOSIS — E039 Hypothyroidism, unspecified: Secondary | ICD-10-CM | POA: Diagnosis not present

## 2024-03-14 DIAGNOSIS — I10 Essential (primary) hypertension: Secondary | ICD-10-CM | POA: Diagnosis not present

## 2024-03-23 DIAGNOSIS — Z79899 Other long term (current) drug therapy: Secondary | ICD-10-CM | POA: Diagnosis not present

## 2024-03-23 DIAGNOSIS — M0609 Rheumatoid arthritis without rheumatoid factor, multiple sites: Secondary | ICD-10-CM | POA: Diagnosis not present

## 2024-04-06 ENCOUNTER — Ambulatory Visit: Payer: PPO | Admitting: Dermatology

## 2024-05-15 ENCOUNTER — Ambulatory Visit: Admitting: Dermatology

## 2024-06-06 ENCOUNTER — Ambulatory Visit: Admitting: Dermatology

## 2024-06-06 DIAGNOSIS — L578 Other skin changes due to chronic exposure to nonionizing radiation: Secondary | ICD-10-CM | POA: Diagnosis not present

## 2024-06-06 DIAGNOSIS — W908XXA Exposure to other nonionizing radiation, initial encounter: Secondary | ICD-10-CM

## 2024-06-06 DIAGNOSIS — Z7189 Other specified counseling: Secondary | ICD-10-CM

## 2024-06-06 DIAGNOSIS — L820 Inflamed seborrheic keratosis: Secondary | ICD-10-CM | POA: Diagnosis not present

## 2024-06-06 DIAGNOSIS — L570 Actinic keratosis: Secondary | ICD-10-CM

## 2024-06-06 DIAGNOSIS — Z5111 Encounter for antineoplastic chemotherapy: Secondary | ICD-10-CM

## 2024-06-06 DIAGNOSIS — Z79899 Other long term (current) drug therapy: Secondary | ICD-10-CM

## 2024-06-06 MED ORDER — CALCIPOTRIENE 0.005 % EX CREA: TOPICAL_CREAM | Freq: Two times a day (BID) | CUTANEOUS | 1 refills | Status: AC

## 2024-06-06 NOTE — Progress Notes (Unsigned)
 Follow-Up Visit   Subjective  Mitchell Mcdowell is a 73 y.o. male who presents for the following: AK 44m f/u, hands, arms, face, pt has used 5FU cr to spot treat areas on hands forehead  The following portions of the chart were reviewed this encounter and updated as appropriate: medications, allergies, medical history  Review of Systems:  No other skin or systemic complaints except as noted in HPI or Assessment and Plan.  Objective  Well appearing patient in no apparent distress; mood and affect are within normal limits.  A focused examination was performed of the following areas: Face, ears, hands, arms  Relevant exam findings are noted in the Assessment and Plan.  face, arms x 15 (15) Pink scaly macules R nasal bridge x 1 Stuck on waxy paps with erythema  Assessment & Plan   ACTINIC DAMAGE WITH PRECANCEROUS ACTINIC KERATOSES Counseling for Topical Chemotherapy Management: Patient exhibits: - Severe, confluent actinic changes with pre-cancerous actinic keratoses that is secondary to cumulative UV radiation exposure over time - Condition that is severe; chronic, not at goal. - diffuse scaly erythematous macules and papules with underlying dyspigmentation - Discussed Prescription Field Treatment topical Chemotherapy for Severe, Chronic Confluent Actinic Changes with Pre-Cancerous Actinic Keratoses Field treatment involves treatment of an entire area of skin that has confluent Actinic Changes (Sun/ Ultraviolet light damage) and PreCancerous Actinic Keratoses by method of PhotoDynamic Therapy (PDT) and/or prescription Topical Chemotherapy agents such as 5-fluorouracil , 5-fluorouracil /calcipotriene , and/or imiquimod.  The purpose is to decrease the number of clinically evident and subclinical PreCancerous lesions to prevent progression to development of skin cancer by chemically destroying early precancer changes that may or may not be visible.  It has been shown to reduce the risk of  developing skin cancer in the treated area. As a result of treatment, redness, scaling, crusting, and open sores may occur during treatment course. One or more than one of these methods may be used and may have to be used several times to control, suppress and eliminate the PreCancerous changes. Discussed treatment course, expected reaction, and possible side effects. - Recommend daily broad spectrum sunscreen SPF 30+ to sun-exposed areas, reapply every 2 hours as needed.  - Staying in the shade or wearing long sleeves, sun glasses (UVA+UVB protection) and wide brim hats (4-inch brim around the entire circumference of the hat) are also recommended. - Call for new or changing lesions.  - Start Calcipotriene  cream followed by 5FU cream to hands bid for 7 days, and to spot treat areas face and hands in the future AK (ACTINIC KERATOSIS) (15) face, arms x 15 (15) Actinic keratoses are precancerous spots that appear secondary to cumulative UV radiation exposure/sun exposure over time. They are chronic with expected duration over 1 year. A portion of actinic keratoses will progress to squamous cell carcinoma of the skin. It is not possible to reliably predict which spots will progress to skin cancer and so treatment is recommended to prevent development of skin cancer.  Recommend daily broad spectrum sunscreen SPF 30+ to sun-exposed areas, reapply every 2 hours as needed.  Recommend staying in the shade or wearing long sleeves, sun glasses (UVA+UVB protection) and wide brim hats (4-inch brim around the entire circumference of the hat). Call for new or changing lesions. Destruction of lesion - face, arms x 15 (15) Complexity: simple   Destruction method: cryotherapy   Informed consent: discussed and consent obtained   Timeout:  patient name, date of birth, surgical site, and procedure verified  Lesion destroyed using liquid nitrogen: Yes   Region frozen until ice ball extended beyond lesion: Yes   Outcome:  patient tolerated procedure well with no complications   Post-procedure details: wound care instructions given    Related Medications fluorouracil  (EFUDEX ) 5 % cream Apply topically 2 (two) times daily. Apply to aa's BID x 7 days INFLAMED SEBORRHEIC KERATOSIS R nasal bridge x 1 Symptomatic, irritating, patient would like treated. Destruction of lesion - R nasal bridge x 1 Complexity: simple   Destruction method: cryotherapy   Informed consent: discussed and consent obtained   Timeout:  patient name, date of birth, surgical site, and procedure verified Lesion destroyed using liquid nitrogen: Yes   Region frozen until ice ball extended beyond lesion: Yes   Outcome: patient tolerated procedure well with no complications   Post-procedure details: wound care instructions given     Return in about 3 months (around 09/06/2024) for AK f/u.  I, Grayce Saunas, RMA, am acting as scribe for Alm Rhyme, MD .   Documentation: I have reviewed the above documentation for accuracy and completeness, and I agree with the above.  Alm Rhyme, MD

## 2024-06-06 NOTE — Patient Instructions (Addendum)
 - Start Calcipotriene  cream followed by Fluorouracil  5% cream to hands twice a day for 7 days, and to spot treat areas face and hands in the future twice a day for 7 days to the scaly spots   5-Fluorouracil /Calcipotriene  Patient Education   Actinic keratoses are the dry, red scaly spots on the skin caused by sun damage. A portion of these spots can turn into skin cancer with time, and treating them can help prevent development of skin cancer.   Treatment of these spots requires removal of the defective skin cells. There are various ways to remove actinic keratoses, including freezing with liquid nitrogen, treatment with creams, or treatment with a blue light procedure in the office.   5-fluorouracil  cream is a topical cream used to treat actinic keratoses. It works by interfering with the growth of abnormal fast-growing skin cells, such as actinic keratoses. These cells peel off and are replaced by healthy ones.   5-fluorouracil /calcipotriene  is a combination of the 5-fluorouracil  cream with a vitamin D analog cream called calcipotriene . The calcipotriene  alone does not treat actinic keratoses. However, when it is combined with 5-fluorouracil , it helps the 5-fluorouracil  treat the actinic keratoses much faster so that the same results can be achieved with a much shorter treatment time.  INSTRUCTIONS FOR 5-FLUOROURACIL /CALCIPOTRIENE  CREAM:   5-fluorouracil /calcipotriene  cream typically only needs to be used for 4-7 days. A thin layer should be applied twice a day to the treatment areas recommended by your physician.   If your physician prescribed you separate tubes of 5-fluourouracil and calcipotriene , apply a thin layer of 5-fluorouracil  followed by a thin layer of calcipotriene .   Avoid contact with your eyes, nostrils, and mouth. Do not use 5-fluorouracil /calcipotriene  cream on infected or open wounds.   You will develop redness, irritation and some crusting at areas where you have pre-cancer  damage/actinic keratoses. IF YOU DEVELOP PAIN, BLEEDING, OR SIGNIFICANT CRUSTING, STOP THE TREATMENT EARLY - you have already gotten a good response and the actinic keratoses should clear up well.  Wash your hands after applying 5-fluorouracil  5% cream on your skin.   A moisturizer or sunscreen with a minimum SPF 30 should be applied each morning.   Once you have finished the treatment, you can apply a thin layer of Vaseline twice a day to irritated areas to soothe and calm the areas more quickly. If you experience significant discomfort, contact your physician.  For some patients it is necessary to repeat the treatment for best results.  SIDE EFFECTS: When using 5-fluorouracil /calcipotriene  cream, you may have mild irritation, such as redness, dryness, swelling, or a mild burning sensation. This usually resolves within 2 weeks. The more actinic keratoses you have, the more redness and inflammation you can expect during treatment. Eye irritation has been reported rarely. If this occurs, please let us  know.  If you have any trouble using this cream, please call the office. If you have any other questions about this information, please do not hesitate to ask me before you leave the office.  Cryotherapy Aftercare  Wash gently with soap and water everyday.   Apply Vaseline and Band-Aid daily until healed.   Due to recent changes in healthcare laws, you may see results of your pathology and/or laboratory studies on MyChart before the doctors have had a chance to review them. We understand that in some cases there may be results that are confusing or concerning to you. Please understand that not all results are received at the same time and often the doctors  may need to interpret multiple results in order to provide you with the best plan of care or course of treatment. Therefore, we ask that you please give us  2 business days to thoroughly review all your results before contacting the office for  clarification. Should we see a critical lab result, you will be contacted sooner.   If You Need Anything After Your Visit  If you have any questions or concerns for your doctor, please call our main line at [phone] and press option 4 to reach your doctor's medical assistant. If no one answers, please leave a voicemail as directed and we will return your call as soon as possible. Messages left after 4 pm will be answered the following business day.   You may also send us  a message via MyChart. We typically respond to MyChart messages within 1-2 business days.  For prescription refills, please ask your pharmacy to contact our office. Our fax number is [phone].  If you have an urgent issue when the clinic is closed that cannot wait until the next business day, you can page your doctor at the number below.    Please note that while we do our best to be available for urgent issues outside of office hours, we are not available 24/7.   If you have an urgent issue and are unable to reach us , you may choose to seek medical care at your doctor's office, retail clinic, urgent care center, or emergency room.  If you have a medical emergency, please immediately call 911 or go to the emergency department.  Pager Numbers  - Dr. Hester: [phone]  - Dr. Jackquline: [phone]  - Dr. Claudene: [phone]   In the event of inclement weather, please call our main line at [phone] for an update on the status of any delays or closures.  Dermatology Medication Tips: Please keep the boxes that topical medications come in in order to help keep track of the instructions about where and how to use these. Pharmacies typically print the medication instructions only on the boxes and not directly on the medication tubes.   If your medication is too expensive, please contact our office at [phone] option 4 or send us  a message through MyChart.   We are unable to tell what your co-pay for  medications will be in advance as this is different depending on your insurance coverage. However, we may be able to find a substitute medication at lower cost or fill out paperwork to get insurance to cover a needed medication.   If a prior authorization is required to get your medication covered by your insurance company, please allow us  1-2 business days to complete this process.  Drug prices often vary depending on where the prescription is filled and some pharmacies may offer cheaper prices.  The website www.goodrx.com contains coupons for medications through different pharmacies. The prices here do not account for what the cost may be with help from insurance (it may be cheaper with your insurance), but the website can give you the price if you did not use any insurance.  - You can print the associated coupon and take it with your prescription to the pharmacy.  - You may also stop by our office during regular business hours and pick up a GoodRx coupon card.  - If you need your prescription sent electronically to a different pharmacy, notify our office through [facility] or by phone at [phone] option 4.     Si Usted [employer]  Despus de Su Visita  Tambin puede enviarnos un mensaje a travs de MyChart. Por lo general respondemos a los mensajes de MyChart en el transcurso de 1 a 2 das hbiles.  Para renovar recetas, por favor pida a su farmacia que se ponga en contacto con nuestra oficina. Randi lakes de fax es Vidor [phone].  Si tiene un asunto urgente cuando la clnica est cerrada y que no puede esperar hasta el siguiente da hbil, puede llamar/localizar a su doctor(a) al nmero que aparece a continuacin.   Por favor, tenga en cuenta que aunque hacemos todo lo posible para estar disponibles para asuntos urgentes fuera del horario de [place], no estamos disponibles las 24 horas del da, los 7 [address]  [address] de la [place].   Si tiene un problema urgente y no puede  comunicarse con nosotros, puede optar por buscar atencin mdica  en el consultorio de su doctor(a), en una clnica privada, en un centro de atencin urgente o en una sala de emergencias.  Si tiene Engineer, drilling, por favor llame inmediatamente al 911 o vaya a la sala de emergencias.  Nmeros de bper  - Dr. Hester: [phone]  - Dra. Jackquline: [phone]  - Dr. Claudene: [phone]   En caso de inclemencias del tiempo, por favor llame a landry capes principal al [phone] para una actualizacin sobre el [place] de cualquier retraso o cierre.  Consejos para la medicacin en dermatologa: Por favor, guarde las cajas en las que vienen los medicamentos de uso tpico para ayudarle a seguir las instrucciones sobre dnde y cmo usarlos. Las farmacias generalmente imprimen las instrucciones del medicamento slo en las cajas y no directamente en los tubos del [place].   Si su medicamento es muy caro, por favor, pngase en contacto con landry rieger llamando al [phone] y presione la opcin 4 o envenos un mensaje a travs de Clinical cytogeneticist.   No podemos decirle cul ser su copago por los medicamentos por adelantado ya que esto es diferente dependiendo de la cobertura de su seguro. Sin embargo, es posible que podamos encontrar un medicamento sustituto a Audiological scientist un formulario para que el seguro cubra el medicamento que se considera necesario.   Si se requiere una autorizacin previa para que su compaa de seguros malta su medicamento, por favor permtanos de 1 a 2 das hbiles para completar este proceso.  Los precios de los medicamentos varan con frecuencia dependiendo del Environmental consultant de dnde se surte la receta y alguna farmacias pueden ofrecer precios ms baratos.  El sitio web www.goodrx.com tiene cupones para medicamentos de Health and safety inspector. Los precios aqu no tienen en cuenta lo que podra costar con la ayuda del seguro (puede ser ms barato con su seguro), pero  el sitio web puede darle el precio si no utiliz Tourist information centre manager.  - Puede imprimir el cupn correspondiente y llevarlo con su receta a la farmacia.  - Tambin puede pasar por nuestra oficina durante el horario de atencin regular y Education officer, museum una tarjeta de cupones de GoodRx.  - Si necesita que su receta se enve electrnicamente a una farmacia diferente, informe a nuestra oficina a travs de MyChart de [place] o por telfono llamando al [phone] y presione la opcin 4.

## 2024-06-08 ENCOUNTER — Encounter: Payer: Self-pay | Admitting: Dermatology

## 2024-07-25 DIAGNOSIS — Z79899 Other long term (current) drug therapy: Secondary | ICD-10-CM | POA: Diagnosis not present

## 2024-07-25 DIAGNOSIS — M0609 Rheumatoid arthritis without rheumatoid factor, multiple sites: Secondary | ICD-10-CM | POA: Diagnosis not present

## 2024-09-05 DIAGNOSIS — I10 Essential (primary) hypertension: Secondary | ICD-10-CM | POA: Diagnosis not present

## 2024-09-05 DIAGNOSIS — E039 Hypothyroidism, unspecified: Secondary | ICD-10-CM | POA: Diagnosis not present

## 2024-09-05 DIAGNOSIS — Z136 Encounter for screening for cardiovascular disorders: Secondary | ICD-10-CM | POA: Diagnosis not present

## 2024-09-07 ENCOUNTER — Ambulatory Visit: Admitting: Dermatology

## 2024-09-07 DIAGNOSIS — L570 Actinic keratosis: Secondary | ICD-10-CM | POA: Diagnosis not present

## 2024-09-07 DIAGNOSIS — L821 Other seborrheic keratosis: Secondary | ICD-10-CM | POA: Diagnosis not present

## 2024-09-07 DIAGNOSIS — L820 Inflamed seborrheic keratosis: Secondary | ICD-10-CM | POA: Diagnosis not present

## 2024-09-07 DIAGNOSIS — Z5111 Encounter for antineoplastic chemotherapy: Secondary | ICD-10-CM

## 2024-09-07 DIAGNOSIS — Z79899 Other long term (current) drug therapy: Secondary | ICD-10-CM

## 2024-09-07 DIAGNOSIS — L578 Other skin changes due to chronic exposure to nonionizing radiation: Secondary | ICD-10-CM

## 2024-09-07 DIAGNOSIS — Z7189 Other specified counseling: Secondary | ICD-10-CM

## 2024-09-07 DIAGNOSIS — W908XXA Exposure to other nonionizing radiation, initial encounter: Secondary | ICD-10-CM

## 2024-09-07 NOTE — Progress Notes (Signed)
 Follow-Up Visit   Subjective  Mitchell Mcdowell is a 73 y.o. male who presents for the following: 3 month aks and isk  Used 5 f/u fluorouracil  calcipotriene  cream to hands did get very inflamed    The patient has spots, moles and lesions to be evaluated, some may be new or changing and the patient may have concern these could be cancer.   The following portions of the chart were reviewed this encounter and updated as appropriate: medications, allergies, medical history  Review of Systems:  No other skin or systemic complaints except as noted in HPI or Assessment and Plan.  Objective  Well appearing patient in no apparent distress; mood and affect are within normal limits.   A focused examination was performed of the following areas: Face, ears, scalp, hands, arms  Relevant exam findings are noted in the Assessment and Plan.  b/l hands and arms x 17, face x 15 (32) Erythematous thin papules/macules with gritty scale.  face x 3 (3) Erythematous stuck-on, waxy papule or plaque  Assessment & Plan    SEBORRHEIC KERATOSIS - Stuck-on, waxy, tan-brown papules and/or plaques  - Benign-appearing - Discussed benign etiology and prognosis. - Observe - Call for any changes  ACTINIC KERATOSIS (32) b/l hands and arms x 17, face x 15 (32)  Start cream December 1  ReStart 5-fluorouracil /calcipotriene  cream twice a day for 10 days to affected areas including hands. Prescription sent to [facility]. Patient advised they will receive an email to purchase the medication online and have it sent to their home. Patient provided with handout reviewing treatment course and side effects and advised to call or message us  on MyChart with any concerns.  Reviewed course of treatment and expected reaction.  Patient advised to expect inflammation and crusting and advised that erosions are possible.  Patient advised to be diligent with sun protection during and after  treatment. Counseled to keep medication out of reach of children and pets.    ACTINIC DAMAGE WITH PRECANCEROUS ACTINIC KERATOSES Counseling for Topical Chemotherapy Management: Patient exhibits: - Severe, confluent actinic changes with pre-cancerous actinic keratoses that is secondary to cumulative UV radiation exposure over time - Condition that is severe; chronic, not at goal. - diffuse scaly erythematous macules and papules with underlying dyspigmentation - Discussed Prescription Field Treatment topical Chemotherapy for Severe, Chronic Confluent Actinic Changes with Pre-Cancerous Actinic Keratoses Field treatment involves treatment of an entire area of skin that has confluent Actinic Changes (Sun/ Ultraviolet light damage) and PreCancerous Actinic Keratoses by method of PhotoDynamic Therapy (PDT) and/or prescription Topical Chemotherapy agents such as 5-fluorouracil , 5-fluorouracil /calcipotriene , and/or imiquimod.  The purpose is to decrease the number of clinically evident and subclinical PreCancerous lesions to prevent progression to development of skin cancer by chemically destroying early precancer changes that may or may not be visible.  It has been shown to reduce the risk of developing skin cancer in the treated area. As a result of treatment, redness, scaling, crusting, and open sores may occur during treatment course. One or more than one of these methods may be used and may have to be used several times to control, suppress and eliminate the PreCancerous changes. Discussed treatment course, expected reaction, and possible side effects. - Recommend daily broad spectrum sunscreen SPF 30+ to sun-exposed areas, reapply every 2 hours as needed.  - Staying in the shade or wearing long sleeves, sun glasses (UVA+UVB protection) and wide brim hats (4-inch brim around the entire circumference of the hat)  are also recommended. - Call for new or changing lesions.     Actinic keratoses are  precancerous spots that appear secondary to cumulative UV radiation exposure/sun exposure over time. They are chronic with expected duration over 1 year. A portion of actinic keratoses will progress to squamous cell carcinoma of the skin. It is not possible to reliably predict which spots will progress to skin cancer and so treatment is recommended to prevent development of skin cancer.  Recommend daily broad spectrum sunscreen SPF 30+ to sun-exposed areas, reapply every 2 hours as needed.  Recommend staying in the shade or wearing long sleeves, sun glasses (UVA+UVB protection) and wide brim hats (4-inch brim around the entire circumference of the hat). Call for new or changing lesions. Destruction of lesion - b/l hands and arms x 17, face x 15 (32) Complexity: simple   Destruction method: cryotherapy   Informed consent: discussed and consent obtained   Timeout:  patient name, date of birth, surgical site, and procedure verified Lesion destroyed using liquid nitrogen: Yes   Region frozen until ice ball extended beyond lesion: Yes   Outcome: patient tolerated procedure well with no complications   Post-procedure details: wound care instructions given    INFLAMED SEBORRHEIC KERATOSIS (3) face x 3 (3) Symptomatic, irritating, patient would like treated. Destruction of lesion - face x 3 (3) Complexity: simple   Destruction method: cryotherapy   Informed consent: discussed and consent obtained   Timeout:  patient name, date of birth, surgical site, and procedure verified Lesion destroyed using liquid nitrogen: Yes   Region frozen until ice ball extended beyond lesion: Yes   Outcome: patient tolerated procedure well with no complications   Post-procedure details: wound care instructions given     Return for 4 to 6 month aks and isk followup.  IEleanor Blush, CMA, am acting as scribe for Alm Rhyme, MD.   Documentation: I have reviewed the above documentation for accuracy and  completeness, and I agree with the above.  Alm Rhyme, MD

## 2024-09-07 NOTE — Patient Instructions (Addendum)
 Restart cream December 1  - Start 5-fluorouracil /calcipotriene  cream twice a day for 10 days to affected areas including hands. Prescription sent to [facility]. Patient advised they will receive an email to purchase the medication online and have it sent to their home. Patient provided with handout reviewing treatment course and side effects and advised to call or message us  on MyChart with any concerns.  Reviewed course of treatment and expected reaction.  Patient advised to expect inflammation and crusting and advised that erosions are possible.  Patient advised to be diligent with sun protection during and after treatment. Counseled to keep medication out of reach of children and pets.  Instructions for [facility]  One or more of your medications was sent to the [facility]. You will receive an email from them and can purchase the medicine through that link. It will then be mailed to your home at the address you confirmed. If for any reason you do not receive an email from them, please check your spam folder. If you still do not find the email, please let us  know. [facility] phone number is [phone].    5-Fluorouracil /Calcipotriene  Patient Education   Actinic keratoses are the dry, red scaly spots on the skin caused by sun damage. A portion of these spots can turn into skin cancer with time, and treating them can help prevent development of skin cancer.   Treatment of these spots requires removal of the defective skin cells. There are various ways to remove actinic keratoses, including freezing with liquid nitrogen, treatment with creams, or treatment with a blue light procedure in the office.   5-fluorouracil  cream is a topical cream used to treat actinic keratoses. It works by interfering with the growth of abnormal fast-growing skin cells, such as actinic keratoses. These cells peel off and are replaced  by healthy ones.   5-fluorouracil /calcipotriene  is a combination of the 5-fluorouracil  cream with a vitamin D analog cream called calcipotriene . The calcipotriene  alone does not treat actinic keratoses. However, when it is combined with 5-fluorouracil , it helps the 5-fluorouracil  treat the actinic keratoses much faster so that the same results can be achieved with a much shorter treatment time.  INSTRUCTIONS FOR 5-FLUOROURACIL /CALCIPOTRIENE  CREAM:   5-fluorouracil /calcipotriene  cream typically only needs to be used for 4-7 days. A thin layer should be applied twice a day to the treatment areas recommended by your physician.   If your physician prescribed you separate tubes of 5-fluourouracil and calcipotriene , apply a thin layer of 5-fluorouracil  followed by a thin layer of calcipotriene .   Avoid contact with your eyes, nostrils, and mouth. Do not use 5-fluorouracil /calcipotriene  cream on infected or open wounds.   You will develop redness, irritation and some crusting at areas where you have pre-cancer damage/actinic keratoses. IF YOU DEVELOP PAIN, BLEEDING, OR SIGNIFICANT CRUSTING, STOP THE TREATMENT EARLY - you have already gotten a good response and the actinic keratoses should clear up well.  Wash your hands after applying 5-fluorouracil  5% cream on your skin.   A moisturizer or sunscreen with a minimum SPF 30 should be applied each morning.   Once you have finished the treatment, you can apply a thin layer of Vaseline twice a day to irritated areas to soothe and calm the areas more quickly. If you experience significant discomfort, contact your physician.  For some patients it is necessary to repeat the treatment for best results.  SIDE EFFECTS: When using 5-fluorouracil /calcipotriene  cream, you may have mild irritation,  such as redness, dryness, swelling, or a mild burning sensation. This usually resolves within 2 weeks. The more actinic keratoses you have, the more redness and  inflammation you can expect during treatment. Eye irritation has been reported rarely. If this occurs, please let us  know.  If you have any trouble using this cream, please call the office. If you have any other questions about this information, please do not hesitate to ask me before you leave the office.   Actinic keratoses are precancerous spots that appear secondary to cumulative UV radiation exposure/sun exposure over time. They are chronic with expected duration over 1 year. A portion of actinic keratoses will progress to squamous cell carcinoma of the skin. It is not possible to reliably predict which spots will progress to skin cancer and so treatment is recommended to prevent development of skin cancer.  Recommend daily broad spectrum sunscreen SPF 30+ to sun-exposed areas, reapply every 2 hours as needed.  Recommend staying in the shade or wearing long sleeves, sun glasses (UVA+UVB protection) and wide brim hats (4-inch brim around the entire circumference of the hat). Call for new or changing lesions.    Cryotherapy Aftercare  Wash gently with soap and water everyday.   Apply Vaseline and Band-Aid daily until healed.    Due to recent changes in healthcare laws, you may see results of your pathology and/or laboratory studies on MyChart before the doctors have had a chance to review them. We understand that in some cases there may be results that are confusing or concerning to you. Please understand that not all results are received at the same time and often the doctors may need to interpret multiple results in order to provide you with the best plan of care or course of treatment. Therefore, we ask that you please give us  2 business days to thoroughly review all your results before contacting the office for clarification. Should we see a critical lab result, you will be contacted sooner.   If You Need Anything After Your Visit  If you have any questions or concerns for your doctor,  please call our main line at [phone] and press option 4 to reach your doctor's medical assistant. If no one answers, please leave a voicemail as directed and we will return your call as soon as possible. Messages left after 4 pm will be answered the following business day.   You may also send us  a message via MyChart. We typically respond to MyChart messages within 1-2 business days.  For prescription refills, please ask your pharmacy to contact our office. Our fax number is [phone].  If you have an urgent issue when the clinic is closed that cannot wait until the next business day, you can page your doctor at the number below.    Please note that while we do our best to be available for urgent issues outside of office hours, we are not available 24/7.   If you have an urgent issue and are unable to reach us , you may choose to seek medical care at your doctor's office, retail clinic, urgent care center, or emergency room.  If you have a medical emergency, please immediately call 911 or go to the emergency department.  Pager Numbers  - Dr. Hester: [phone]  - Dr. Jackquline: [phone]  - Dr. Claudene: [phone]   - Dr. Raymund: [phone]  In the event of inclement weather, please call our main line at [phone] for an update on the status of any delays or  closures.  Dermatology Medication Tips: Please keep the boxes that topical medications come in in order to help keep track of the instructions about where and how to use these. Pharmacies typically print the medication instructions only on the boxes and not directly on the medication tubes.   If your medication is too expensive, please contact our office at [phone] option 4 or send us  a message through MyChart.   We are unable to tell what your co-pay for medications will be in advance as this is different depending on your insurance coverage. However, we may be able to find a substitute medication at lower  cost or fill out paperwork to get insurance to cover a needed medication.   If a prior authorization is required to get your medication covered by your insurance company, please allow us  1-2 business days to complete this process.  Drug prices often vary depending on where the prescription is filled and some pharmacies may offer cheaper prices.  The website www.goodrx.com contains coupons for medications through different pharmacies. The prices here do not account for what the cost may be with help from insurance (it may be cheaper with your insurance), but the website can give you the price if you did not use any insurance.  - You can print the associated coupon and take it with your prescription to the pharmacy.  - You may also stop by our office during regular business hours and pick up a GoodRx coupon card.  - If you need your prescription sent electronically to a different pharmacy, notify our office through [facility] or by phone at [phone] option 4.     Si Usted Necesita Algo Despus de Su Visita  Tambin puede enviarnos un mensaje a travs de Clinical Cytogeneticist. Por lo general respondemos a los mensajes de MyChart en el transcurso de 1 a 2 das hbiles.  Para renovar recetas, por favor pida a su farmacia que se ponga en contacto con nuestra oficina. Randi lakes de fax es Miranda [phone].  Si tiene un asunto urgente cuando la clnica est cerrada y que no puede esperar hasta el siguiente da hbil, puede llamar/localizar a su doctor(a) al nmero que aparece a continuacin.   Por favor, tenga en cuenta que aunque hacemos todo lo posible para estar disponibles para asuntos urgentes fuera del horario de [place], no estamos disponibles las 24 horas del da, los 7 [address]  [address] de la [place].   Si tiene un problema urgente y no puede comunicarse con nosotros, puede optar por buscar atencin mdica  en el consultorio de su doctor(a), en una clnica privada, en un centro de atencin urgente o en  una sala de emergencias.  Si tiene engineer, drilling, por favor llame inmediatamente al 911 o vaya a la sala de emergencias.  Nmeros de bper  - Dr. Hester: [phone]  - Dra. Jackquline: [phone]  - Dr. Claudene: [phone]  - Dra. Kitts: [phone]  En caso de inclemencias del [place], por favor llame a nuestra lnea principal al [phone] para una actualizacin sobre el estado de cualquier retraso o cierre.  Consejos para la medicacin en dermatologa: Por favor, guarde las cajas en las que vienen los medicamentos de uso tpico para ayudarle a seguir las instrucciones sobre dnde y cmo usarlos. Las farmacias generalmente imprimen las instrucciones del medicamento slo en las cajas y no directamente en los tubos del [place].   Si su medicamento es muy caro, por favor, pngase en contacto con landry rieger llamando al [phone] y  presione la opcin 4 o envenos un mensaje a travs de MyChart.   No podemos decirle cul ser su copago por los medicamentos por adelantado ya que esto es diferente dependiendo de la cobertura de su seguro. Sin embargo, es posible que podamos encontrar un medicamento sustituto a audiological scientist un formulario para que el seguro cubra el medicamento que se considera necesario.   Si se requiere una autorizacin previa para que su compaa de seguros cubra su medicamento, por favor permtanos de 1 a 2 das hbiles para completar este proceso.  Los precios de los medicamentos varan con frecuencia dependiendo del environmental consultant de dnde se surte la receta y alguna farmacias pueden ofrecer precios ms baratos.  El sitio web www.goodrx.com tiene cupones para medicamentos de health and safety inspector. Los precios aqu no tienen en cuenta lo que podra costar con la ayuda del seguro (puede ser ms barato con su seguro), pero el sitio web puede darle el precio si no utiliz tourist information centre manager.  - Puede imprimir el cupn correspondiente y llevarlo con su receta a  la farmacia.  - Tambin puede pasar por nuestra oficina durante el horario de atencin regular y education officer, museum una tarjeta de cupones de GoodRx.  - Si necesita que su receta se enve electrnicamente a una farmacia diferente, informe a nuestra oficina a travs de MyChart de [place] o por telfono llamando al [phone] y presione la opcin 4.

## 2024-09-12 DIAGNOSIS — Z0000 Encounter for general adult medical examination without abnormal findings: Secondary | ICD-10-CM | POA: Diagnosis not present

## 2024-09-12 DIAGNOSIS — E039 Hypothyroidism, unspecified: Secondary | ICD-10-CM | POA: Diagnosis not present

## 2024-09-12 DIAGNOSIS — D696 Thrombocytopenia, unspecified: Secondary | ICD-10-CM | POA: Diagnosis not present

## 2024-09-12 DIAGNOSIS — Z1331 Encounter for screening for depression: Secondary | ICD-10-CM | POA: Diagnosis not present

## 2024-09-12 DIAGNOSIS — I10 Essential (primary) hypertension: Secondary | ICD-10-CM | POA: Diagnosis not present

## 2024-09-13 ENCOUNTER — Encounter: Payer: Self-pay | Admitting: Dermatology

## 2024-11-14 NOTE — Progress Notes (Signed)
 CRAIGORY TOSTE                                          MRN: [id number]   11/14/2024   The [facility] Quality Team Specialist reviewed this patient medical record for the purposes of chart review for care gap closure. The following were reviewed: chart review for care gap closure-controlling blood pressure.    [facility] Quality Team

## 2025-01-18 ENCOUNTER — Ambulatory Visit: Admitting: Dermatology
